# Patient Record
Sex: Female | Born: 1935 | Race: White | Hispanic: No | Smoking: Never smoker
Health system: Southern US, Community
[De-identification: ages and names within clinical notes are randomized; demographics above are authoritative.]

## PROBLEM LIST (undated history)

## (undated) DIAGNOSIS — F0390 Unspecified dementia without behavioral disturbance: Secondary | ICD-10-CM

## (undated) DIAGNOSIS — E213 Hyperparathyroidism, unspecified: Secondary | ICD-10-CM

## (undated) DIAGNOSIS — K5792 Diverticulitis of intestine, part unspecified, without perforation or abscess without bleeding: Secondary | ICD-10-CM

## (undated) DIAGNOSIS — M1990 Unspecified osteoarthritis, unspecified site: Secondary | ICD-10-CM

## (undated) DIAGNOSIS — I639 Cerebral infarction, unspecified: Secondary | ICD-10-CM

---

## 1997-07-03 ENCOUNTER — Encounter: Admission: RE | Admit: 1997-07-03 | Discharge: 1997-07-03 | Payer: Self-pay | Admitting: Family Medicine

## 1999-03-07 ENCOUNTER — Other Ambulatory Visit: Admission: RE | Admit: 1999-03-07 | Discharge: 1999-03-07 | Payer: Self-pay | Admitting: Internal Medicine

## 1999-05-06 ENCOUNTER — Encounter: Payer: Self-pay | Admitting: Internal Medicine

## 1999-05-06 ENCOUNTER — Encounter: Admission: RE | Admit: 1999-05-06 | Discharge: 1999-05-06 | Payer: Self-pay | Admitting: Internal Medicine

## 1999-05-24 ENCOUNTER — Encounter: Payer: Self-pay | Admitting: Internal Medicine

## 1999-05-24 ENCOUNTER — Encounter: Admission: RE | Admit: 1999-05-24 | Discharge: 1999-05-24 | Payer: Self-pay | Admitting: Internal Medicine

## 1999-06-09 ENCOUNTER — Encounter: Admission: RE | Admit: 1999-06-09 | Discharge: 1999-06-09 | Payer: Self-pay | Admitting: Internal Medicine

## 1999-06-09 ENCOUNTER — Encounter: Payer: Self-pay | Admitting: Internal Medicine

## 1999-07-27 ENCOUNTER — Encounter: Admission: RE | Admit: 1999-07-27 | Discharge: 1999-07-27 | Payer: Self-pay | Admitting: Internal Medicine

## 1999-07-27 ENCOUNTER — Encounter: Payer: Self-pay | Admitting: Internal Medicine

## 2000-03-07 ENCOUNTER — Other Ambulatory Visit: Admission: RE | Admit: 2000-03-07 | Discharge: 2000-03-07 | Payer: Self-pay | Admitting: Internal Medicine

## 2000-05-24 ENCOUNTER — Encounter: Payer: Self-pay | Admitting: Internal Medicine

## 2000-05-24 ENCOUNTER — Encounter: Admission: RE | Admit: 2000-05-24 | Discharge: 2000-05-24 | Payer: Self-pay | Admitting: Family Medicine

## 2000-05-28 ENCOUNTER — Encounter: Payer: Self-pay | Admitting: Internal Medicine

## 2000-05-28 ENCOUNTER — Encounter: Admission: RE | Admit: 2000-05-28 | Discharge: 2000-05-28 | Payer: Self-pay | Admitting: Internal Medicine

## 2001-05-27 ENCOUNTER — Encounter: Admission: RE | Admit: 2001-05-27 | Discharge: 2001-05-27 | Payer: Self-pay | Admitting: Internal Medicine

## 2001-05-27 ENCOUNTER — Encounter: Payer: Self-pay | Admitting: Internal Medicine

## 2002-12-02 ENCOUNTER — Encounter: Admission: RE | Admit: 2002-12-02 | Discharge: 2002-12-02 | Payer: Self-pay | Admitting: Sports Medicine

## 2002-12-09 ENCOUNTER — Encounter: Admission: RE | Admit: 2002-12-09 | Discharge: 2002-12-09 | Payer: Self-pay | Admitting: Sports Medicine

## 2002-12-23 ENCOUNTER — Ambulatory Visit: Admission: RE | Admit: 2002-12-23 | Discharge: 2002-12-23 | Payer: Self-pay | Admitting: Gynecologic Oncology

## 2002-12-24 ENCOUNTER — Encounter: Payer: Self-pay | Admitting: Gynecologic Oncology

## 2002-12-24 ENCOUNTER — Ambulatory Visit (HOSPITAL_COMMUNITY): Admission: RE | Admit: 2002-12-24 | Discharge: 2002-12-24 | Payer: Self-pay | Admitting: Gynecologic Oncology

## 2003-02-11 ENCOUNTER — Encounter (INDEPENDENT_AMBULATORY_CARE_PROVIDER_SITE_OTHER): Payer: Self-pay | Admitting: Specialist

## 2003-02-11 ENCOUNTER — Ambulatory Visit: Admission: RE | Admit: 2003-02-11 | Discharge: 2003-02-11 | Payer: Self-pay | Admitting: Gynecologic Oncology

## 2003-02-11 ENCOUNTER — Other Ambulatory Visit: Admission: RE | Admit: 2003-02-11 | Discharge: 2003-02-11 | Payer: Self-pay | Admitting: Obstetrics and Gynecology

## 2003-04-09 ENCOUNTER — Encounter: Admission: RE | Admit: 2003-04-09 | Discharge: 2003-04-09 | Payer: Self-pay | Admitting: Thoracic Surgery

## 2003-04-09 ENCOUNTER — Encounter: Admission: RE | Admit: 2003-04-09 | Discharge: 2003-04-09 | Payer: Self-pay | Admitting: Gynecology

## 2003-06-24 ENCOUNTER — Ambulatory Visit (HOSPITAL_COMMUNITY): Admission: RE | Admit: 2003-06-24 | Discharge: 2003-06-24 | Payer: Self-pay | Admitting: Gynecology

## 2003-06-26 ENCOUNTER — Ambulatory Visit (HOSPITAL_COMMUNITY): Admission: RE | Admit: 2003-06-26 | Discharge: 2003-06-26 | Payer: Self-pay | Admitting: Gynecology

## 2003-06-30 ENCOUNTER — Ambulatory Visit: Admission: RE | Admit: 2003-06-30 | Discharge: 2003-06-30 | Payer: Self-pay | Admitting: Gynecologic Oncology

## 2003-08-05 ENCOUNTER — Encounter: Admission: RE | Admit: 2003-08-05 | Discharge: 2003-08-05 | Payer: Self-pay | Admitting: Sports Medicine

## 2004-01-19 ENCOUNTER — Ambulatory Visit (HOSPITAL_COMMUNITY): Admission: RE | Admit: 2004-01-19 | Discharge: 2004-01-19 | Payer: Self-pay | Admitting: Neurology

## 2004-09-08 ENCOUNTER — Inpatient Hospital Stay (HOSPITAL_COMMUNITY): Admission: EM | Admit: 2004-09-08 | Discharge: 2004-09-12 | Payer: Self-pay | Admitting: *Deleted

## 2004-11-01 ENCOUNTER — Inpatient Hospital Stay (HOSPITAL_COMMUNITY): Admission: EM | Admit: 2004-11-01 | Discharge: 2004-11-04 | Payer: Self-pay | Admitting: Emergency Medicine

## 2004-11-04 ENCOUNTER — Ambulatory Visit: Payer: Self-pay | Admitting: Physical Medicine & Rehabilitation

## 2004-11-04 ENCOUNTER — Inpatient Hospital Stay
Admission: RE | Admit: 2004-11-04 | Discharge: 2004-11-15 | Payer: Self-pay | Admitting: Physical Medicine & Rehabilitation

## 2005-05-17 ENCOUNTER — Encounter: Admission: RE | Admit: 2005-05-17 | Discharge: 2005-05-17 | Payer: Self-pay | Admitting: Internal Medicine

## 2005-05-24 ENCOUNTER — Encounter (HOSPITAL_COMMUNITY): Admission: RE | Admit: 2005-05-24 | Discharge: 2005-08-22 | Payer: Self-pay | Admitting: Internal Medicine

## 2005-05-26 ENCOUNTER — Inpatient Hospital Stay (HOSPITAL_COMMUNITY): Admission: EM | Admit: 2005-05-26 | Discharge: 2005-06-06 | Payer: Self-pay | Admitting: Emergency Medicine

## 2005-05-30 ENCOUNTER — Encounter (INDEPENDENT_AMBULATORY_CARE_PROVIDER_SITE_OTHER): Payer: Self-pay | Admitting: *Deleted

## 2005-11-20 ENCOUNTER — Inpatient Hospital Stay (HOSPITAL_COMMUNITY): Admission: EM | Admit: 2005-11-20 | Discharge: 2005-11-29 | Payer: Self-pay | Admitting: Emergency Medicine

## 2005-11-21 ENCOUNTER — Encounter (INDEPENDENT_AMBULATORY_CARE_PROVIDER_SITE_OTHER): Payer: Self-pay | Admitting: Cardiology

## 2008-09-18 ENCOUNTER — Emergency Department (HOSPITAL_COMMUNITY): Admission: EM | Admit: 2008-09-18 | Discharge: 2008-09-18 | Payer: Self-pay | Admitting: Emergency Medicine

## 2009-07-17 ENCOUNTER — Emergency Department (HOSPITAL_BASED_OUTPATIENT_CLINIC_OR_DEPARTMENT_OTHER)
Admission: EM | Admit: 2009-07-17 | Discharge: 2009-07-17 | Payer: Self-pay | Source: Home / Self Care | Admitting: Emergency Medicine

## 2010-04-16 ENCOUNTER — Encounter: Payer: Self-pay | Admitting: Gynecology

## 2010-04-16 ENCOUNTER — Encounter: Payer: Self-pay | Admitting: Thoracic Surgery

## 2010-04-17 ENCOUNTER — Encounter: Payer: Self-pay | Admitting: Internal Medicine

## 2010-04-17 ENCOUNTER — Encounter: Payer: Self-pay | Admitting: Thoracic Surgery

## 2010-08-12 NOTE — H&P (Signed)
NAME:  Kaylee Braun, [place]                  ACCOUNT NO.:  [id number]   MEDICAL RECORD NO.:  [id number]          PATIENT TYPE:  INP   LOCATION:  1506                         FACILITY:  [facility]   PHYSICIAN:  Hal T. Stoneking, M.D. DATE OF BIRTH:  October 14, 1921   DATE OF ADMISSION:  10/31/2004  DATE OF DISCHARGE:  11/04/2004                                HISTORY & PHYSICAL   IDENTIFYING DATA:  Kaylee Braun is a very nice 77 year old [place] female.  She  had been staying with her daughter, plans eventually to move to [facility].  She enjoyed excellent health until she lost her balance at her  daughter's home and fell fracturing her wrist and the left 9th, 10th, and  11th ribs.  Because of the pain and inability to ambulate safely, she was  admitted for further treatment and observation.   CURRENT MEDICATIONS:  None.   PAST MEDICAL HISTORY:  Remarkable for diverticulosis, neuropathy involving  the left leg.   PAST SURGICAL HISTORY:  None.   SOCIAL HISTORY:  The patient recently is living with her daughter, getting  ready to move to [facility].  No history of alcohol or cigarette use.   FAMILY HISTORY:  Unremarkable.   REVIEW OF SYSTEMS:  She does complain of pain in the left wrist and ribs,  but prior to this and prior to the fall she stated that she had been feeling  fairly well.   PHYSICAL EXAMINATION:  VITAL SIGNS:  Blood pressure 174/83, pulse rate 69,  respiratory rate 18, O2 saturations 97%.  SKIN:  Normal.  HEENT:  Unremarkable.  LUNGS:  Clear.  HEART:  Regular rate and rhythm without murmurs, rubs, or gallops.  ABDOMEN:  Soft.  No hepatosplenomegaly or masses palpated.   LABORATORY DATA:  Montalvo count 11,900, hemoglobin 13.6, platelet count  171,000, 74% neutrophils, 15% lymphs, 10% monos. Sodium 137, potassium 4.5,  chloride 108, bicarb 25, glucose 114, BUN 25, creatinine 1.0, calcium 10.4.   X-rays of the left wrist revealed a transverse fracture of the distal radial  metaphysis.  Rib films revealed anterior fractures of the left 9th, 10th,  and 11th ribs.  Also there was a persistent nodule noted in the right lung.   ASSESSMENT:  Left wrist and rib fracture, she will need orthopedic  assessment and likely consultation from physical therapy before determining  her outcome and disposition.           ______________________________  Sunday Spillers Pete Glatter, M.D.     HTS/MEDQ  D:  12/18/2004  T:  12/19/2004  Job:  [id number]

## 2010-08-12 NOTE — Discharge Summary (Signed)
NAME:  Kaylee Braun, Kaylee Braun                  ACCOUNT NO.:  [id number]   MEDICAL RECORD NO.:  [id number]          PATIENT TYPE:  INP   LOCATION:  1506                         FACILITY:  [facility]   PHYSICIAN:  Ladell Pier, M.D.   DATE OF BIRTH:  February 11, 1922   DATE OF ADMISSION:  11/02/2006  DATE OF DISCHARGE:  11/04/2004                                 DISCHARGE SUMMARY   She is discharged to [facility] .   DISCHARGE DIAGNOSES:  1.  Multiple rib fractures. Left 9th, 10th and 11th rib fractures      anteriorly.  2.  Transverse fracture of the distal radial metaphysis of the left forearm.  3.  Diverticulosis.  4.  Neuropathy of the left leg.  5.  Elevated blood pressure, question secondary to Cymbalta.  6.  Pain secondary to the rib and radial fracture.  7.  Laceration to lower lip status post suture by emergency room physician.  8.  Hypercalcemia.   CONSULTATIONS:  1.  Orthopedics, Dr. Luiz Blare.  2.  SACU .   DISCHARGE MEDICATIONS:  1.  Vicodin 1 q.6 p.r.n. for pain.  2.  Tylenol 500 q. 6 p.r.n. for pain.  3.  Colace 100 mg daily.   PROCEDURE:  None.   HISTORY OF PRESENT ILLNESS:  The patient is an 77 year old [place] female. She  was staying with her daughter with plans of moving to __________ [facility].  She was in excellent health without any medical problems until she lost her  balance and fell at her daughter's house and fractured her wrist and rib and  she also had a laceration to the lower lip which was sutured. She was  admitted to the hospital for pain control.   Past medical history, family history, social history, meds, allergies, and  review of systems per admission H&P.   PHYSICAL EXAMINATION ON DISCHARGE:  VITAL SIGNS:  Temperature 97.4, pulse  80, respirations 18, blood pressure 168/82, pulse ox 98% on room air.  HEENT:  Head is normocephalic, atraumatic. Pupils equal round and reactive  to light. Lip with laceration and some edema.  CARDIOVASCULAR:  Regular rate and rhythm. No  murmurs, rubs or gallops.  LUNGS:  Clear to auscultation bilaterally. No wheezes, rhonchi or rales.  ABDOMEN:  Soft, nontender, nondistended, positive bowel sounds.  EXTREMITIES:  Her left arm has splint and wrapped. Lower extremities without  edema.   HOSPITAL COURSE:  #1.  RIB FRACTURE/RADIAL FRACTURE.  The patient was  admitted and treated with pain medication Vicodin. She was getting 2 Vicodin  q.6 and she got very sleep so it was decreased to 1 Vicodin q.6 alternating  with Tylenol. Her family thought that she was not strong enough to go to  __________ [facility] and they wanted her to get some physical therapy.  Physical therapy consulted, recommended going to [facility]  for short stay before  going home. The patient was discharged to [facility] for rehab.   #2.  ELEVATED BLOOD PRESSURE. During her hospitalization, her blood pressure  has been elevated. When the Cymbalta was added, it seemed as if her blood  pressure increased even more so the Cymbalta was discontinued. She was put  on Norvasc during the hospital stay to keep her blood pressure down. she  will have to be reevaluated outpatient for hypertension but her blood  pressure in the hospital has gone up to as high as 193/111, 170/100. It has  always been elevated. It has been even 203/83 during her hospitalization and  when she was admitted it was also elevated. A lot of this could be the pain  but she could also have a component of hypertension so will go ahead and  treat her with Norvasc for now and will reevaluate at a later date.   #3.  PAIN SECONDARY TO THE FRACTURE.  Will continue the Vicodin p.r.n.  alternating with Tylenol.   DISCHARGE LABS:  Sodium 137, potassium 4.1, chloride 105, CO2 25, glucose  113, BUN 18, creatinine 0.9, calcium 10.9. WBC 7.8, hemoglobin 14.6,  platelets 168. Her final lab was noted to be 10.9 but all the prior labs  calcium was normal, it was 10.4 the day prior to that. If it remains  elevated then  will monitor outpatient. It could be just a lab error since  all of her previous labs were normal.      Ladell Pier, M.D.  Electronically Signed     NJ/MEDQ  D:  11/04/2004  T:  11/04/2004  Job:  [id number]

## 2010-08-12 NOTE — H&P (Signed)
NAME:  Kaylee Braun, Kaylee Braun                  ACCOUNT NO.:  [id number]   MEDICAL RECORD NO.:  [id number]          PATIENT TYPE:  EMS   LOCATION:  MAJO                         FACILITY:  [facility]   PHYSICIAN:  Pramod P. Pearlean Brownie, MD    DATE OF BIRTH:  Jul 17, 1921   DATE OF ADMISSION:  11/20/2005  DATE OF DISCHARGE:                                HISTORY & PHYSICAL   REASON FOR REFERRAL:  Stroke.   HISTORY OF PRESENT ILLNESS:  Kaylee Braun is an 77 year old pleasant Caucasian  lady who lives in assisted living at [facility], who noticed the sudden  onset of slurred speech, right facial droop and right-sided weakness, onset  at about 2:00 p.m. today.  The patient went to Dr. Kevan Ny office and was  advised to come to [facility] emergency room for further evaluation.  I saw  the patient at 5:15 p.m., and she had mild right facial droop and right-  sided weakness which was improved but not back to baseline.  The patient has  no known prior history of stroke.  She does have history of peripheral  neuropathy for which she has seen Dr. Anne Hahn in the past.  She denies at  present headache, double vision, word-finding difficulties or numbness.   PAST MEDICAL HISTORY:  1. Diverticulosis.  2. Osteoporosis.  3. Osteoarthritis.  4. Hyperparathyroidism.  5. Peripheral neuropathy.   MEDICATION ALLERGIES:  None.   HOME MEDICATIONS:  1. Lasix.  2. Potassium.  3. Colace.  4. Multivitamin.  5. Aspirin.   PAST SURGICAL HISTORY:  1. Right breast lumpectomy.  2. Right radial and rib fracture.   FAMILY HISTORY:  Noncontributory.   SOCIAL HISTORY:  She lives in assisted living.  She is a widow.  She is  independent in activities of daily living.  She has a son who lives nearby  and a daughter.  She does not smoke or drink.   REVIEW OF SYSTEMS:  Not significant for recent fever, cough, shortness of  breath, diarrhea or other illness.   PHYSICAL EXAMINATION:  GENERAL:  A pleasant elderly Caucasian lady who  is  not in distress.  VITAL SIGNS:  She is afebrile.  Pulse is 70 per minute and regular,  respirations 16 per minute.  Distal pulses well felt.  Blood pressure  140/80, distal pulses were well felt.  HEENT:  Nontraumatic.  ENT exam significant for mild decrease in hearing  bilaterally.  NECK:  Supple without bruit.  CARDIAC:  Regular heart sounds.  LUNGS:  Clear to auscultation.  ABDOMEN:  Soft, nontender.  NEUROLOGIC:  The patient is awake, alert.  She is oriented to time, place  and person.  There is no aphasia or hypoxia.  There is mild dysarthria.  She  slurs some words.  There is mild right nasolabial fold asymmetry.  Eye  movements are full without nystagmus.  Visual fields full to confrontation  testing.  Palatal movements are diminished.  Tongue is midline.  Motor  system exam reveals mild right upper extremity drift, impaired finger-to-  nose coordination on the right.  There  is weakness of the right grip and  intrinsic hand muscles.  She __________ the left or right upper extremity.  There is mild right lower extremity drift.  There is minimum finger-to-nose  dysmetria on the right.  There is no sensory loss.  Reflexes are brisk.  Ankle jerks are depressed bilaterally.  Position and vibration impaired over  the toes.  Plantars are downgoing.  Gait was not tested.   DATA REVIEWED:  Admission labs are pending at this time.  On the NIH stroke  scale she is code 5.   IMPRESSION:  An 77 year old lady with the sudden onset of dysarthria, right  facial weakness and right hemiparesis secondary to left-sided subcortical  infarct.  The patient has presented beyond 3 hours from onset of symptoms  but within 6 hours.  She still has persistent deficit and may benefit with  consideration and participation in the ANCROD stroke trial.  I discussed  this with the patient, as well as the son who is present at the bedside, and  discussed risks and benefits of participation and clearly  mentioned that the  patient's participation in voluntary.  Patient has the option of being on  __________ medical treatment.  The patient and the son both feel she is  interested in being considered for the study.  We will see if the patient  meets the inclusion/exclusion criteria, and, if so, she will sign the  consent form before any study specific procedure is done.  She will be  admitted to the stroke unit for further evaluation.  Blood pressure will be  tightly controlled.  We will check MRI scan of the brain with MRA, as well  as carotid ultrasound and Doppler studies, fasting lipid profile, hemoglobin  A1c and homocysteine levels.  We will continue aspirin for now and may need  to change to Aggrenox later.           ______________________________  Sunny Schlein. Pearlean Brownie, MD     PPS/MEDQ  D:  11/20/2005  T:  11/20/2005  Job:  [id number]   cc:   Duncan Dull, M.D.

## 2010-08-12 NOTE — H&P (Signed)
NAME:  Kaylee Braun, Kaylee Braun                  ACCOUNT NO.:  [id number]   MEDICAL RECORD NO.:  [id number]          PATIENT TYPE:  EMS   LOCATION:  ED                           FACILITY:  [facility]   PHYSICIAN:  Hal T. Stoneking, M.D. DATE OF BIRTH:  [birth year]   DATE OF ADMISSION:  10/31/2004  DATE OF DISCHARGE:                                HISTORY & PHYSICAL   IDENTIFYING DATA:  This very nice 77 year old [place] female currently lives  with her daughter.  She has plans to move in to [facility].  She has  really been in excellent health. She did have an episode of  diverticulosis/diverticulitis treated as an outpatient.  Responded well to  p.o. antibiotics.  This evening she had gone out to dinner, was back home  getting into bed and her foot came out of her shoe.  She lost her balance,  striking her left anterior chest and her left wrist.  She sustained a  laceration to the left lower lip and received some sutures by the ER doctor.  Was also noted to have acute left anterior 9, 10 and 11 rib fractures for  which she is having significant chest wall pain and a left nondisplaced  transverse radial fracture of the wrist.  She is unable the move about in  bed, unable to ambulate, due to the pain in her left rib cage and will need  admission until her pain can get under control and she is again ambulatory.   ALLERGIES:  No known drug allergies.   CURRENT MEDICATIONS:  None.   PAST MEDICAL HISTORY:  1. Remarkable for diverticulosis.  2. Neuropathy of the left leg.  3. No hypertension, diabetes or coronary artery disease.   PAST SURGICAL HISTORY:  She states she has had no previous surgeries.   SOCIAL HISTORY:  Currently living with her daughter but plans to move to  [facility] [employer].  She does not smoke, does not consume alcohol.  She has two children, Pam [employer]  Pam Viveiros's phone number  is [phone].  Richard [place], [phone].   FAMILY HISTORY:  Remarkable for a  stroke in her mother and also her sister.   REVIEW OF SYSTEMS:  No headache, no change in vision or hearing.  No  abdominal pain.  Does complain of the chest wall pain that comes in  paroxysms.  No trouble with moving her bowels or urine.   PHYSICAL EXAMINATION:  VITAL SIGNS:  Blood pressure 170/96, temperature  98.3, pulse 93, respiratory rate 20.  HEENT:  Pupils are equal, round and reactive to light.  Disks are sharp.  Tympanic membranes normal.  Oropharynx reveals laceration of the left side  of the lower lip.  LUNGS:  Clear.  HEART:  Regular rate and rhythm without murmurs.  ABDOMEN:  Obese, soft.  No hepatosplenomegaly or masses palpated.  EXTREMITIES:  Left forearm is in a splint.  NEUROLOGIC:  She is alert and oriented x3.   LABORATORY DATA:  Chest x-ray reveals an anterior left 9, 10 and 11 rib  fractures.  She has a transverse fracture of the left distal radius that  appears to be well aligned.   ASSESSMENT:  1. Left rib fracture with considerable pain, unable to ambulate at the      current time.  Will try Vicodin to help control the pain and muscle      spasm.  2. Left distal radial fracture.  Will eventually need orthopedic followup.   PLAN:  Will admit.  Trial of p.o. Vicodin.  Discharge to [facility] when  able to ambulate safely.       HTS/MEDQ  D:  11/01/2004  T:  11/01/2004  Job:  [id number]

## 2010-08-12 NOTE — H&P (Signed)
NAME:  Kaylee Braun, Kaylee Braun                  ACCOUNT NO.:  [id number]   MEDICAL RECORD NO.:  [id number]          PATIENT TYPE:  EMS   LOCATION:  MAJO                         FACILITY:  [facility]   PHYSICIAN:  Georgann Housekeeper, MD      DATE OF BIRTH:  1921-11-17   DATE OF ADMISSION:  05/26/2005  DATE OF DISCHARGE:                                HISTORY & PHYSICAL   CHIEF COMPLAINT:  Nausea, vomiting, diarrhea.   PRIMARY CARE PHYSICIAN:  Dr. Johnella Moloney   Patient is an 77 year old female from the assisted living [facility].  History of peripheral neuropathy, osteoporosis, diverticulosis,  osteoarthritis, mild incontinence, hyperparathyroidism.  Presents with a two-  day history of nausea, vomiting, diarrhea started yesterday.  Said she had  multiple vomiting and diarrhea and continued through the night and she had  not thrown up since coming to the ER.  She had no blood in the stool.  No  abdominal pain.  Denies any fevers or chills.  No cough.  No shortness of  breath or chest pain.  She was found to be dehydrated mildly in the  emergency room, by the levels of BUN and creatinine.  She had been on Lasix  at home.   PAST MEDICAL HISTORY:  No known drug allergies.   CURRENT MEDICATIONS:  1.  Lasix 40 mg daily.  2.  Potassium 20 mEq b.i.d.  3.  Colace 100 mg daily.  4.  Multivitamin.  5.  Aspirin 81 mg daily.   MEDICAL PROBLEMS:  1.  Peripheral neuropathy.  2.  Osteoporosis.  3.  Diverticulosis.  4.  DJD.  5.  Incontinence.  6.  Bilateral ovarian mass.  7.  Mildly elevated calcium with mild hyperparathyroidism.   SURGICAL HISTORY:  1.  Right breast lumpectomy.  2.  Radial fracture and rib fracture in 2006.   FAMILY HISTORY:  Noncontributory.   SOCIAL HISTORY:  She lives at assisted living [facility].  Widow.  Has a  son and a daughter.  Son was present with her.   PHYSICAL EXAMINATION:  VITAL SIGNS:  Temperature 99.3, blood pressure  133/76, pulse 118, respirations 18, 98%  saturations.  GENERAL:  Awake and alert, cooperative, no acute distress.  HEENT:  Mucous membranes was mildly dry.  NECK:  Supple.  CARDIAC:  S1, S2 without murmurs.  ABDOMEN:  Soft.  No rebound or guarding.  Nondistended.  Nontender.  EXTREMITIES:  No edema.  SKIN:  No rashes.   LABORATORY DATA:  Martinec count 19.3, hemoglobin 17.8, platelets 253.  Chemistries:  Sodium 149, potassium 5, BUN 47, creatinine 1.5, calcium 11.1,  glucose 189.   IMPRESSION:  77 year old female with nausea, vomiting, and dehydration.  1.  Nausea, vomiting, diarrhea.  Probably acute gastroenteritis.  Will plan      admit for observation.  Intravenous fluids.  Clear liquid diet.  Will      check some stool studies.  Hold off Lasix and potassium.  As far as      mildly elevated leukocytosis, repeat it again.  Add lipase and if she  does okay with a diet tomorrow can send her back to the assisted living.      Georgann Housekeeper, MD  Electronically Signed     KH/MEDQ  D:  05/26/2005  T:  05/26/2005  Job:  [id number]

## 2010-08-12 NOTE — Discharge Summary (Signed)
NAME:  Kaylee Braun, Kaylee Braun                  ACCOUNT NO.:  [id number]   MEDICAL RECORD NO.:  [id number]          PATIENT TYPE:  ORB   LOCATION:  4526                         FACILITY:  [facility]   PHYSICIAN:  Ellwood Dense, M.D.   DATE OF BIRTH:  April 01, 1921   DATE OF ADMISSION:  11/04/2004  DATE OF DISCHARGE:  11/14/2004                                 DISCHARGE SUMMARY   DISCHARGE DIAGNOSES:  1.  Left distal radius fracture.  2.  Multiple left rib fractures of 9th, 10th and 11 ribs.  3.  Pain management.  4.  Subcutaneous Lovenox for deep venous thrombosis prophylaxis.  5.  History of diverticulitis.   HISTORY OF PRESENT ILLNESS:  This is an 77 year old Kaylee Braun female admitted  November 01, 2004 after a fall, loss of consciousness, with complaint of left  wrist and left thorax pain.  X-rays with nondisplaced left distal radius  fracture and left rib fractures, 9th, 10th and 11th, Orthopedic Services,  Dr. Luiz Blare, weightbearing as tolerated with short arm cast applied.  Subcutaneous tissue Lovenox for deep venous thrombosis prophylaxis.  The  patient was admitted to subacute care services.   PAST MEDICAL HISTORY:  See discharge diagnoses.   ALLERGIES:  NONSTEROIDAL ANTI-INFLAMMATORIES.   SOCIAL HISTORY:  The patient has lived with her daughter for the last 7  weeks, had previously lived alone until bout of diverticulitis.  Plan is to  be discharged to [facility].   MEDICATIONS PRIOR TO ADMISSION:  None.   HOSPITAL COURSE:  Patient with progressive gains while on rehab services  with therapies initiated daily.  The following issues were followed during  patient's rehab course:  Pertaining to Kaylee Braun's left distal radius  fracture, a short arm case had been applied, weightbearing as tolerated,  multiple left rib fractures with conservative care.  She was using Vicodin  as needed for pain management with good results.  Subcutaneous Lovenox for  deep venous thrombosis  prophylaxis was discontinued at time of discharge as  her mobility greatly improved.  She had a history of diverticulitis without  issue during her rehab stay.  Some modest elevation in diastolic blood  pressures of mid-to-upper 80s.  She had no dizziness and no chest pain, no  shortness of breath.  She was advised to follow up with her primary care  physician, Dr. Merlene Laughter.  Plan was to be discharged to [facility] on November 14, 2004.   FUNCTIONAL MOBILITY:  She is minimum-to-moderate assist -- bed mobility,  minimal assist -- transfers, ambulating up to 70 feet with minimal  assistance and a platform walker, simple setups for activities of daily  living, has had minimal assist for lower body dressing.   DISCHARGE MEDICATIONS:  Discharge medications at time of dictation included:  1.  Vicodin as needed -- pain.  2.  A multivitamin once a day.  3.  Tylenol as needed.   ACTIVITY:  Activity as tolerated.   DIET:  Regular.   FOLLOWUP:  She would follow up with Dr. Pete Glatter, medical management; Dr.  Jodi Geralds, [facility].      Mariam Dollar, P.A.    ______________________________  Ellwood Dense, M.D.    DA/MEDQ  D:  11/14/2004  T:  11/14/2004  Job:  [id number]   cc:   Hal T. Stoneking, M.D.  [address]  [place], [place] [address]  Fax: [phone]   Harvie Junior, M.D.  [address]  [place]  [place] [address]  Fax: [phone]

## 2010-08-12 NOTE — Discharge Summary (Signed)
NAME:  Kaylee Braun, Kaylee Braun                  ACCOUNT NO.:  [id number]   MEDICAL RECORD NO.:  [id number]          PATIENT TYPE:  INP   LOCATION:  5738                         FACILITY:  [facility]   PHYSICIAN:  Candyce Churn, M.D.DATE OF BIRTH:  09-17-1921   DATE OF ADMISSION:  09/08/2004  DATE OF DISCHARGE:  09/12/2004                                 DISCHARGE SUMMARY   DISCHARGE DIAGNOSES:  1.  Sigmoid diverticulitis.  2.  Cystic ovarian mass measuring 9.6 x 9.9 cm to the right of midline.      Comparison size is slightly larger than it was 9 x 9.3 cm on CT scan of      the pelvis on January 19, 2004.  CA-125 level is normal at 10.  3.  Peripheral neuropathy.  4.  History of right breast lumpectomy.  5.  History of diverticulosis.   DISCHARGE MEDICATIONS:  1.  Ciprofloxacin 250 mg p.o. b.i.d. x7 days.  2.  Flagyl 250 mg three times a day for seven days.  3.  Neurontin 100 mg p.o. t.i.d.  4.  Colace 100 mg p.o. t.i.d. p.r.n. constipation.  5.  Triamcinolone cream 0.1% applied to eczematous rash b.i.d.   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   HOSPITAL COURSE:  Patient is an 77 year old female with history of  osteoporosis, allergies, diverticulosis, DJD of the lumbar spine, peripheral  neuropathy of the feet, and bilateral ovarian masses.  She has had a  borderline increase in calcium in the past with borderline elevation of the  PTH.   She presented to the emergency room on September 08, 2004 with sudden onset of  left lower quadrant pain and two days of constipation and CT scanning was  consistent with sigmoid  diverticulitis.  She also was having a slightly  enlarging solid cystic mass most consistent with ovarian etiology.  This has  also been noted on January 19, 2004.  This is being followed by Dr. [name].   She also has a history of small right pulmonary nodule.  This has been  followed by Dr. Edwyna Shell.   Patient was admitted and treated with intravenous fluids and antibiotic  therapy in the form of IV Cipro and IV Flagyl and had a very prompt response  with reduction in abdominal pain.   Patient was afebrile on admission but Cull[place] cell count dropped from 9500 on  admission to 4900 on September 10, 2004.   Other laboratories reveal a hemoglobin of 14.3 on admission, platelet count  162,000.  Sedimentation rate on admission was 13 mm/hour.  LFTs were within  normal limits on September 08, 2004 and BMET was normal with a creatinine of 1  and a glucose of 98.  TSH was normal at  1.702 and CA-125 antigen was 10  units/mL and B12 level was 404, normal.  Urinalysis on admission was within  normal limits.   Patient was discharged in improved condition and will receive GYN follow-up  and I will plan to see her back in the office in one week.   She was to get home health through [facility].      Candyce Churn, M.D.  Electronically Signed     RNG/MEDQ  D:  11/10/2004  T:  11/10/2004  Job:  [id number]

## 2010-08-12 NOTE — Consult Note (Signed)
NAME:  Kaylee Braun, Kaylee Braun                  ACCOUNT NO.:  [id number]   MEDICAL RECORD NO.:  [id number]          PATIENT TYPE:  INP   LOCATION:  5740                         FACILITY:  [facility]   PHYSICIAN:  Velora Heckler, MD      DATE OF BIRTH:  06-22-1921   DATE OF CONSULTATION:  05/29/2005  DATE OF DISCHARGE:                                   CONSULTATION   REFERRING PHYSICIAN:  Dr. Johnella Moloney.   CHIEF COMPLAINT:  Primary hyperparathyroidism.   HISTORY OF PRESENT ILLNESS:  Kaylee Braun is an 77 year old [place] female  admitted on May 26, 2005 by Dr. Johnella Moloney with diarrhea, nausea,  weakness, and fatigue. Prior to admission, the patient had been undergoing a  workup for hypercalcemia. She had been found to have serum calcium levels  ranging between 11.1 and 11.5. She was started on oral Lasix and hydration.  Laboratory studies included an intact PTH level on May 03, 2005. This  was elevated at 201. The patient was subsequently scheduled for sestamibi  scan in nuclear medicine which was performed on February28, 2007. This  revealed evidence of a left inferior parathyroid adenoma. I have reviewed  this study. The patient has known osteoporosis. She has no history of  nephrolithiasis. She does have chronic fatigue. General surgery is now  consulted for evaluation for minimally invasive parathyroidectomy.   PAST MEDICAL HISTORY:  1.  History of peripheral neuropathy.  2.  History of osteoporosis.  3.  History of degenerative joint disease.  4.  History of incontinence.  5.  History of bilateral ovarian masses.  6.  Status post right breast lumpectomy for benign disease.  7.  History of left wrist fracture 2006.   MEDICATIONS:  Lasix, potassium, Colace, enteric-coated baby aspirin,  multivitamin.   ALLERGIES:  None known.   SOCIAL HISTORY:  The patient lives in assisted living. She is widowed. Her  children live locally. She denies tobacco or alcohol use.   FAMILY HISTORY:  No  history of familial endocrinopathy.   LABORATORY STUDIES:  Lecy count 8.5, hemoglobin 13.0, platelet count  168,000. Electrolytes were normal. Her calcium level this morning is down to  10.3 with therapy. PTH level is pending. TSH level was 0.314.   RADIOGRAPHY:  Sestamibi scan performed nuclear medicine February28, 2007  demonstrating evidence of left inferior parathyroid adenoma.   IMPRESSION:  Primary hyperparathyroidism with chronic fatigue and  osteoporosis.   PLAN:  I have discussed all these issues with the patient. I feel she is a  good candidate for a minimally invasive surgery. I have discussed the case  with Dr. Johnella Moloney. Risk and benefits of the procedure have been  reviewed with the patient. The patient would like to proceed with surgery in  hopes that we will improve her chronic fatigue and GI complaints. I think  she is an acceptable risk for general anesthesia and for surgery. We will  try to schedule her at the [facility] operating room in the near future.      Velora Heckler, MD  Electronically Signed  TMG/MEDQ  D:  05/29/2005  T:  05/29/2005  Job:  [id number]   cc:   Candyce Churn, M.D.  Fax: [phone]

## 2010-08-12 NOTE — Discharge Summary (Signed)
NAME:  Kaylee Braun, Kaylee Braun                  ACCOUNT NO.:  [id number]   MEDICAL RECORD NO.:  [id number]          PATIENT TYPE:  INP   LOCATION:  5740                         FACILITY:  [facility]   PHYSICIAN:  Candyce Churn, M.D.DATE OF BIRTH:  04-Dec-1921   DATE OF ADMISSION:  05/26/2005  DATE OF DISCHARGE:                                 DISCHARGE SUMMARY   DISCHARGE DIAGNOSES:  1.  Viral gastroenteritis, resolved.  2.  Primary hyperparathyroidism secondary to large left parathyroid adenoma.  3.  Left lower pole parathyroidectomy per Dr. Darnell Level on May 30, 2005      with normalization and excellent clinical response to persistent fatigue      and failure to thrive.  4.  Right ovarian mass, stable in comparison to imaging techniques from      October 2006.  5.  Mild peripheral neuropathy.  6.  Osteoporosis.  7.  Diverticulosis.  8.  Degenerative joint disease.  9.  History of right breast lumpectomy.  10. Left radial fracture and rib fracture 2006 secondary to fall.  11. Failure to thrive, improving since removal of parathyroid adenoma.   DISCHARGE MEDICATIONS:  1.  Calcium carbonate 1200 mg daily.  2.  Multivitamin one daily.  3.  Dulcolax 10 mg p.o. p.r.n. constipation.  4.  Ambien 5 mg p.o. h.s. p.r.n. insomnia.   PROCEDURES:  Left lower pole parathyroid adenoma with parathyroidectomy at  same site per Dr. Darnell Level on May 30, 2005.   IMAGING STUDIES:  1.  Chest CT May 29, 2005:  Right lower lobe subpleural nodule stable.      Recommended follow-up in six to 12 months to document further stability.  2.  Pelvic ultrasound:  Right adnexal cyst which is septated measuring 8.9 x      7.7 x 7.4 cm.  It appears slightly smaller than measurements obtained on      CT scan of the abdomen on January 18, 2005.  Left ovary not seen.  3.  Parathyroid scintigraphy and SPECT imaging May 24, 2005:      Parathyroid adenoma localized to the lower pole of the left lobe of the  thyroid gland.   DISCHARGE LABORATORIES:  From June 05, 2005 Casali count 6100, hemoglobin  11.5, platelet count 209,000.  Sodium 143, potassium 4, chloride 111,  bicarbonate 25, glucose 95, BUN 6, creatinine 0.9, calcium 8.6.  Thyroid  functions on May 29, 2005 reveal a free T4 of 1.19, TSH of 0.881, and total  T3 of 77.2, all within normal limits.   HOSPITAL COURSE:  [name] is a very pleasant 77 year old female who  had been living at [facility] assisted living with essentially failure to  thrive.  It was noted that she was hypercalcemic and parathyroid adenoma was  localized as above.  She has been improved markedly since having had this  removed over the last week.   She was admitted on May 26, 2005 by Dr. Georgann Housekeeper for gastroenteritis  presenting with nausea, vomiting, diarrhea very consistent with the  community Norovirus which has been in  our community over the last couple of  months.  She was treated with IV fluids and clear liquid diet which has  progressed now to a regular diet and she is doing well in that regard.   She was seen in consultation by Dr. Darnell Level and parathyroidectomy was  performed on May 30, 2005 and calcium has normalized, had been in the 11-12  range and she is feeling much better in terms of stamina and even mental  status is much clearer.  Overall, seems to be doing quite well and is  ambulating now safely with a walker and will be transferred back to [facility] assisted living today.   She should have follow-up for her right adnexal mass with ultrasound in  three to four months and I will plan to see her back in my office in about  three weeks.      Candyce Churn, M.D.  Electronically Signed     RNG/MEDQ  D:  06/06/2005  T:  06/06/2005  Job:  [id number]   cc:   Velora Heckler, MD  [address] [place]  [place] [address]

## 2010-08-12 NOTE — Discharge Summary (Signed)
NAME:  Kaylee Braun, Kaylee Braun                  ACCOUNT NO.:  [id number]   MEDICAL RECORD NO.:  [id number]          PATIENT TYPE:  INP   LOCATION:  6741                         FACILITY:  [facility]   PHYSICIAN:  Pramod P. Pearlean Brownie, MD    DATE OF BIRTH:  [birth year]   DATE OF ADMISSION:  11/20/2005  DATE OF DISCHARGE:  11/22/2005                                 DISCHARGE SUMMARY   DISCHARGE DIAGNOSES:  1. Small left cerebellar infarct.  2. Urinary tract infection.  3. Mild dementia at baseline.  4. Elevated parathyroid hormone.  5. Dyslipidemia.   MEDICATIONS AT TIME OF DISCHARGE:  1. Cipro 500 mg p.o. b.i.d., start November 20, 2005, end November 23, 2005      a.m. dose.  2. Aspirin 325 mg a day.  3. Zocor 20 mg a day.   STUDIES PERFORMED:  1. MRI of the brain shows a punctate area of restricted perfusion weighted      image within the left cerebellar hemisphere with multiple areas of      susceptibility within the basal ganglia and ____________ nuclei      bilaterally. Advanced Silveria matter changes.  2. MRA of the head shows atherosclerotic irregularity of the supraclinoid      internal carotid artery bilaterally with mild approximately 50%      stenosis on the left, attenuation of the distal MCA and PCA vessels      bilaterally, fetal-type right posterior cerebral artery.  3. Chest x-ray shows a trace of left pleural effusion. Right lower lobe      nodular is not as well seen as on prior studies but which may be due to      portable technique. Follow up as previously recommended on chest CT      report of May 29, 2005.  4. EKG showed normal sinus rhythm, possible atrial enlargement, possible      anterior infarct - age undetermined.  5. Carotid Doppler shows no ICA stenosis.  6. Two-D echocardiogram has been completed; results are pending.  7. CT of the brain done on admission shows chronic small-vessel ischemic      disease. Bilateral remote appearing thalamic lacunar infarcts. Remote  basal ganglion lacunar infarcts.   LABORATORY STUDIES:  Homocysteine 8.5. Hemoglobin A1c 5.5. Cholesterol 204,  triglycerides 115, HDL 50 and LDL 131. Her urinalysis showed large leukocyte  esterase with urine culture pending at time of discharge. CBC with  hemoglobin 15.2, Barros blood cells 7.8. Chemistry normal. Coagulation  studies normal. Liver function test normal. Calcium normal.   HISTORY OF PRESENT ILLNESS:  Ms. Kaylee Braun is an 77 year old Caucasian  female who lives in assisted living who noticed a sudden onset of slurred  speech, right facial weakness and right sided weakness at approximately 2  p.m. the day of admission. The patient went to Dr. Kevan Ny' office and was  advise to come to the [facility] Emergency Room for further evaluation. The  patient was seen at 5:15 p.m. where she had a mild right facial droop and  right sided weakness which was improved but  not back to baseline. The  patient has no known history of stroke. She does have history of peripheral  neuropathy for which she has seen Dr. Anne Hahn in the past. She denies  headache, double vision, word-finding difficulties or numbness. She was not  a TPA candidate secondary to time. She was admitted to the hospital for  further stroke evaluation.   HOSPITAL COURSE:  MRI did reveal an acute infarct in the left cerebellum.  She neurologically cleared of symptoms in the hospital. The patient did have  some difficulty swallowing which upon questioning family was old. The  patient had been recommended to be on a thick-liquid diet at home; however,  she was not compliant. She routinely coughs after eating and drinking food.  This also was noted in the hospital where a modified barium swallow was  performed. During the modified barium swallow, the patient had positive  aspiration of thin liquids which was easily managed by the recommendation of  nectar-thick consistency. She had no PT or OT acute needs, but PT was   recommended for followup at discharge as was speech therapy. She was started  on aspirin for secondary stroke prevention and a statin for elevated LDL.  She will need follow up by her primary care physician and Dr. Pearlean Brownie.   CONDITION ON DISCHARGE:  The patient is alert and oriented x3. Speech clear.  No aphasia. She has visual fields that are full. Face is symmetric, and she  has no drift. Her strength is normal. She has decreased sensation in her  lower extremities. Her chest is clear to auscultation. Her heart rate is  regular but tachycardic. She aspirates thin liquids.   DISCHARGE PLAN:  1. Discharge back to assisted living as long as they can manage her diet.  2. Nectar-thick consistency liquids.  3. Speech therapy for followup. Recommend outpatient speech therapy where      they can do vital stim. If patient is unable to get transportation, we      will do home health speech therapy. Will arrange physical therapy to      compliment speech therapy, either at home or as an outpatient.  4. Aspirin for secondary stroke prevention.  5. __________ for elevated LDL. Goal LDL less than 100. Will need follow      up in 4 to 6 weeks by her primary care physician.  6. Follow up with primary care physician in one month.  7. Follow up with Dr. Delia Heady at [facility] in 2 to 3      months.      Annie Main, N.P.    ______________________________  Sunny Schlein. Pearlean Brownie, MD    SB/MEDQ  D:  11/22/2005  T:  11/22/2005  Job:  [id number]   cc:   [facility]  Candyce Churn, M.D.

## 2010-08-12 NOTE — Op Note (Signed)
NAME:  Kaylee Braun, Kaylee Braun                  ACCOUNT NO.:  [id number]   MEDICAL RECORD NO.:  [id number]          PATIENT TYPE:  INP   LOCATION:  5740                         FACILITY:  [facility]   PHYSICIAN:  Velora Heckler, MD      DATE OF BIRTH:  [birth year]   DATE OF PROCEDURE:  05/30/2005  DATE OF DISCHARGE:                                 OPERATIVE REPORT   PREOPERATIVE DIAGNOSIS:  Primary hyperparathyroidism.   POSTOPERATIVE DIAGNOSIS:  Primary hyperparathyroidism, thyroid nodule.   PROCEDURE:  1.  Minimally invasive left inferior parathyroidectomy.  2.  Excision thyroid nodule from thyroid isthmus.   SURGEON:  Velora Heckler, M.D., FACS   ASSISTANT:  Anselm Pancoast. Zachery Dakins, M.D., FACS   ANESTHESIA:  General.   ESTIMATED BLOOD LOSS:  Minimal.   PREPARATION:  Betadine.   COMPLICATIONS:  None.   INDICATIONS:  The patient is 77 year old [place] female from [place], [place], referred by Dr. Johnella Moloney for hyperparathyroidism.  The  patient had been admitted on March 2.  She was seen in consultation due to  known hypercalcemia and elevated parathyroid hormone levels.  Sestamibi scan  performed February28 localized an adenoma to the left inferior position.  The patient is prepared brought to the operating room.   BODY OF REPORT:  The procedure is done in OR number 1 at [facility].  The patient is brought to the operating room and placed in supine  position on the operating room table.  Following the administration of  general anesthesia, the patient is prepped and draped in usual strict  aseptic fashion.  After ascertaining that an adequate level of anesthesia  had been obtained, a left inferior neck incision was made a #10 blade.  Dissection was carried through subcutaneous tissues and platysma.  Hemostasis was obtained with electrocautery.  Skin flaps were developed  cephalad and caudad.  Strap muscles were incised in the midline.  There is a  palpable 1 cm nodule  in the mid isthmus of the thyroid.  The remainder of  the thyroid gland is normal to palpation.  Strap muscles are reflected  laterally.  Left thyroid lobe was mobilized.  Venous tributaries were  divided between small and medium hemoclips.  Dissection posteriorly reveals  an enlarged parathyroid gland lying just posterior to the lateral edge of  the esophagus.  The recurrent nerve is immediately adjacent.  With gentle  blunt dissection, the parathyroid gland is dissected away from the nerve.  Vascular tributaries were divided between small and medium hemoclips.  The  gland was completely mobile mobilized and excised.  It measures 2.5 x 1 x 1  cm in size.  Dr. Berneta Levins performed frozen section and confirms  parathyroid tissue consistent with adenoma.  Good hemostasis was obtained.   Thyroid nodule is palpable in the mid isthmus.  The isthmus was mobilized  superiorly and inferiorly.  Hemostats were used to divide the thyroid  parenchyma circumferentially around the nodule and the nodule was completely  excised.  It is submitted for permanent section only.  The  thyroid tissue  was suture ligated with 3-0 Vicryl suture ligatures and good hemostasis was  noted.  Surgicel was placed in the left neck.  Strap muscles were  reapproximated in the midline with interrupted 3-0 Vicryl sutures.  Platysma  was closed with interrupted 3-0 Vicryl sutures.  Skin was anesthetized with  local Marcaine.  Skin edges were reapproximated running 4-0 Vicryl  subcuticular suture.  Wound is washed and dried.  Benzoin and Steri-Strips  were applied.  Sterile dressings were applied.  The patient is awakened from  anesthesia and brought to the recovery room in stable condition.  The  patient tolerated the procedure well.      Velora Heckler, MD  Electronically Signed     TMG/MEDQ  D:  05/30/2005  T:  05/30/2005  Job:  [id number]   cc:   Candyce Churn, M.D.  Fax: [phone]

## 2010-08-12 NOTE — Consult Note (Signed)
NAME:  Kaylee Braun, Kaylee Braun                            ACCOUNT NO.:  [id number]   MEDICAL RECORD NO.:  [id number]                   PATIENT TYPE:  OUT   LOCATION:  GYN                                  [facility]ACILITY[facility]  [facility]   PHYSICIAN:  John T. [name], M.D.                 DATE OF BIRTH:  [birth year]   DATE OF CONSULTATION:  02/11/2003  DATE OF DISCHARGE:                                   CONSULTATION   CHIEF COMPLAINT:  Follow-up of pelvic mass and pulmonary nodule.   INTERVAL HISTORY:  The patient's pulmonary nodule was evaluated with a  negative PET scan on January 02, 2003, and that scan showed a photopenic area  in the midpelvis correlated with her pelvic mass.  On the basis of these  findings, Ines Bloomer, M.D., recommended that the patient have follow-  up scans at two and six months following the PET scan to further follow up  her 1 cm spiculated right lower lobe lesion.  The patient has had no further  symptoms from her adnexal mass.   HISTORY OF PRESENT ILLNESS:  The patient was evaluated for pyuria in  September 2004, and ultrasound revealed bilateral adnexal masses including  an 8.7 x 8.3 cm simple cyst on the right and multicystic left adnexal mass  with no area of free fluid.  She subsequently had a CT scan revealing a 1 cm  spiculated nodule in the right lower lobe of the lung and confirming her  adnexal masses.  CA125 value was less than 12.   PAST MEDICAL HISTORY:  Significant for hypertension, plantar fasciitis,  urinary urgency, osteoporosis.   PAST SURGICAL HISTORY:  Benign breast lumpectomy and traumatic pelvic/lower  extremity fracture at age 56.   MEDICATIONS:  Atenolol, aspirin, calcium carbonate, magnesium, multivitamin,  Ditropan, Flonase, fish oil, Restoril, Fosamax, and Lacril.   ALLERGIES:  DAYPRO, Z-PAK, and VIOXX.   Otherwise, past history, personal and social history, family history, and  review of systems are unchanged from those recorded during my  intake  evaluation on December 23, 2002.   PHYSICAL EXAMINATION:  VITAL SIGNS:  Weight 151 pounds, blood pressure  154/84.  LYMPHATIC:  There is no pathologic lymphadenopathy.  ABDOMEN:  Soft and benign without ascites, mass, tenderness, or  organomegaly.  BACK:  There is tenderness to percussion over the lumbar spine but no CVA  tenderness.  EXTREMITIES:  No edema.  PELVIC:  External genitalia and BUS are normal to inspection and palpation.  The vaginal mucosa is atrophic without lesions.  The cervix is mobile and  deviated to the patient's left.  Bimanual and rectovaginal examinations  reveal a normal uterus deviated to the left and an 8 cm, cystic mass  involving the right adnexa.  This is mobile without fixation, and there is  no cul-de-sac nodularity.   ASSESSMENT:  Benign adnexal masses.   PLAN:  The  patient wishes to defer surgery unless completely necessary.  We  performed a Pap smear today so that, if negative, we could consider a  laparoscopic BSO rather than an open procedure involving removal of the  uterus.  This mainly has to do with issues revolving around recovery from  surgery.  I recommended that we set up so that her follow-up pulmonary scan  include an abdominopelvic CT scan and if the mass is stable, that the repeat  in four month also include the pelvis.  We will make sure that we get copies  of these reports and I would like to see her after her second CT scan unless  she develops significant abdominopelvic pain or other worrisome symptoms.  I  believe that the likelihood of malignancy in this scan-detected mass less  than 0.5% and that serial observation would be a reasonable option,  particularly until we sort out the pulmonary nodule.                                               John T. [name], M.D.    JTS/MEDQ  D:  02/11/2003  T:  02/11/2003  Job:  [id number]   cc:   Candyce Churn, M.D.  [address] [place]  [place] [address]  Fax:  [phone]   Rozanna Boer., M.D.  [address]  [place]  [place] [address]  Fax: [phone]   Telford Nab, R.N.  [phone]  [place], [place] [address]   Ines Bloomer, M.D.  [address]  [place]  [place] [address]

## 2010-08-12 NOTE — H&P (Signed)
NAME:  Kaylee Braun, Kaylee Braun                  ACCOUNT NO.:  [id number]   MEDICAL RECORD NO.:  [id number]          PATIENT TYPE:  INP   LOCATION:  5738                         FACILITY:  [facility]   PHYSICIAN:  Candyce Churn, M.D.DATE OF BIRTH:  May 20, 1921   DATE OF ADMISSION:  09/08/2004  DATE OF DISCHARGE:  09/12/2004                                HISTORY & PHYSICAL   CHIEF COMPLAINT:  Abdominal pain.   HISTORY OF PRESENT ILLNESS:  An 77 year old female with a history of  osteoporosis, allergies, diverticulosis, and DJD of the lumbar spine.  Also  has urge incontinence and peripheral neuropathy in her feet as well as  bilateral ovarian masses, borderline hypercalcemia with borderline  hyperparathyroidism.  She presented to the emergency room on the morning of  September 08, 2004, with sudden onset of left lower quadrant pain and two days of  constipation.  Abdominal CT scan is consistent with sigmoid diverticulitis.  She also has a slightly enlarging solid/cystic mass consistent with an  ovarian mass on pelvic CT in October of 2005.  Dimensions were 9.0 x 9.3 cm  and are now 9.6 x 9.9 cm on comparison with a CT scan from January 19, 2004.   She is admitted for IV antibiotic therapy and further workup.   MEDICATIONS:  1.  Neurontin 100 mg p.o. t.i.d.  2.  Alpha lipoic acid supplementation.   PAST SURGICAL HISTORY:  Right breast lumpectomy.   HEALTH MAINTENANCE STATUS:  Sigmoidoscopy in March of 1999 revealed 250 cm  were within normal limits except for a sigmoid diverticulosis.   FAMILY HISTORY:  Parkinson's disease in her father.  Mother had hypertension  and a stroke.  A sister died of emphysema and also had a stroke, and a  brother who had a stroke.  He was severely obese.   HABITS:  No smoking or alcohol.   SOCIAL HISTORY:  The patient has been widowed for more than 25 years.  She  is a housewife.  Husband was very involved in athletics and activities for  children in [place]  for many years.   REVIEW OF SYSTEMS:  Denies fevers or chills but does complain of left lower  quadrant abdominal pain.  Denies melena or bright red blood from rectum, or  urinary frequency, hesitancy, or dysuria.   PHYSICAL EXAMINATION:  GENERAL:  She is alert and oriented and in no acute  distress.  VITAL SIGNS:  Temperature is 98.8, pulse 71 and regular, respiratory rate 18  and unlabored, blood pressure 122/73.  O2 saturation on room air is 92%.  HEENT:  She is atraumatic, normocephalic.  NECK:  Supple without JVD, thyromegaly, or carotid bruits.  CHEST:  Clear to auscultation.  CARDIAC:  Regular rhythm with no murmurs, rubs, or gallops.  ABDOMEN:  Soft and nondistended but is tender in the left lower quadrant to  light palpation.  There does seem to be a question of rebound.  EXTREMITIES:  Without cyanosis, clubbing, or edema.  No rashes noted.  Warm  distally in hands and feet.  Capillary refill is within two to  three  seconds.  NEUROLOGICAL:  She is oriented x3.  She has had a history of mild, short-  term, memory loss.  Otherwise nonfocal except her feet have a sensation of a  burning pain chronically.   LABORATORY DATA:  Rock count 9500, hemoglobin 14.3, platelet count 102,000.  Sodium 134, potassium 4.0, chloride 106, bicarb 23, BUN 15, creatinine 1.0,  glucose 106.  LFTs are normal.  Calcium 10.3.  Urinalysis reveals pyuria  with 0 to 2 red cells and negative nitrite.   Abdominal and pelvic CT scan is as above.   ASSESSMENT:  1.  Diverticulitis - treated with IV Cipro and Flagyl, and IV Dilaudid for      pain.  Will also treat with IV fluids and start a clear liquids and      advance to full liquids as tolerated.  Will give a stool softener as      well.  2.  Neuropathy - a B12, TSH, and sed rate.  3.  Ovarian mass - further workup per Dr. [name].  Also, Dr. Edwyna Shell follows      her for her right lower lobe pulmonary nodule, which was      uncharacteristic for  malignancy on PET scan in 2004.      Candyce Churn, M.D.  Electronically Signed     RNG/MEDQ  D:  12/24/2004  T:  12/24/2004  Job:  [id number]   cc:   Ines Bloomer, M.D.  [address]  [place]  [place] [address]   Jackquline Denmark. [name], M.D.  [address]  [place]  [place] [address]

## 2010-08-12 NOTE — Consult Note (Signed)
NAME:  Kaylee Braun, Kaylee Braun                            ACCOUNT NO.:  [id number]   MEDICAL RECORD NO.:  [id number]                   PATIENT TYPE:  OUT   LOCATION:  GYN                                  FACILITY:  [facility]   PHYSICIAN:  John T. Soper, M.D.                 DATE OF BIRTH:  [birth year]   DATE OF CONSULTATION:  06/30/2003  DATE OF DISCHARGE:                                   CONSULTATION   CHIEF COMPLAINT:  Followup of ovarian cyst.   INTERVAL HISTORY:  Since I saw the patient approximately six months ago, she  has had stable pulmonary nodule on serial chest CT scans in January and  again on June 26, 2003.  She has had another CA-125 value that was normal.  She denies pelvic or abdominal complaints, change in bowel or bladder  function.  She and her family wish conservative management if possible.   PAST MEDICAL HISTORY:  Significant for hypertension, plantar fascitis,  urinary urgency, and osteoporosis.   PAST SURGICAL HISTORY:  Benign breast lumpectomy, and traumatic pelvic/lower  extremity fracture.   MEDICATIONS:  Include atenolol, aspirin, calcium carbonate, magnesium,  multivitamins, Ditropan, Flonase, fish oil, Restoril, Fosamax, and Lacril.   ALLERGIES:  1. DAYPRO.  2. Z-PAK.  3. VIOXX.   Otherwise, my past history, personal social history, family history, and  review of systems are unchanged from those recorded during my intake  evaluation in September, 2004.   PHYSICAL EXAMINATION:  Weight is 150 pounds and vital signs stable.  Examination is deferred, per patient's request.   LABS:  CT of the chest and abdomen performed on June 26, 2003 revealed  essentially stable measurements on both the right lower lobe pulmonary  nodule and the pelvic mass.  On today's study, the right ovarian mass  measures out at 8.2 x 8.7 cm.  On immediate prior CT scan, 8.5 x 9 cm.  On  CT scan in the autumn was 8.4 x 8.7 cm.  Given the small difference, this  may represent settling or  orientation of the lesion.  There is no evidence  of ascites or retroperitoneal lymphadenopathy.  Most recent CA-125 value on  June 24, 2003 was 11.0.   ASSESSMENT:  Benign complex ovarian cyst, asymptomatic and essentially  stable on multiple scans over the past six months.  Likely benign pulmonary  nodule.   PLAN/RECOMMENDATIONS:  I had a long discussion with the patient and her  family, in excess of 15 minutes, regarding the differential diagnosis of the  ovarian cyst and indications for surgery.  These would include symptoms of  pain, change in bowel or bladder function, significant enlargement of her  adnexal mass, increasing complexity or other features to suggest malignancy.  In the absence of these, I believe it would be reasonable to continue to  observe her.   Depending on Dr. Scheryl Darter recommendations for following the  pulmonary  nodule, I would feel quite comfortable repeating imaging in approximately  six months, but if she is to undergo a CT of the chest sooner, her abdomen  and pelvis could be imaged at that time.  I would like to see her back for  followup in six months.                                               John T. [name], M.D.    JTS/MEDQ  D:  06/30/2003  T:  07/01/2003  Job:  [id number]   cc:   Ines Bloomer, M.D.  [address]  [place]  [place] [address]   Candyce Churn, M.D.  [address] [place]  [place] [address]  Fax: [phone]   Telford Nab, R.N.  [address]  [place], [place] [address]   Rozanna Boer., M.D.  [address]  [place]  [place] [address]  Fax: [phone]
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
## 2010-08-26 ENCOUNTER — Emergency Department (HOSPITAL_COMMUNITY)
Admission: EM | Admit: 2010-08-26 | Discharge: 2010-08-26 | Disposition: A | Discharge status: Home / Self Care | Payer: Medicare Other | Attending: Emergency Medicine | Admitting: Emergency Medicine

## 2010-08-26 ENCOUNTER — Emergency Department (HOSPITAL_COMMUNITY): Payer: Medicare Other

## 2010-08-26 DIAGNOSIS — M25569 Pain in unspecified knee: Secondary | ICD-10-CM | POA: Insufficient documentation

## 2010-08-26 DIAGNOSIS — E0590 Thyrotoxicosis, unspecified without thyrotoxic crisis or storm: Secondary | ICD-10-CM | POA: Insufficient documentation

## 2010-08-26 DIAGNOSIS — Z8673 Personal history of transient ischemic attack (TIA), and cerebral infarction without residual deficits: Secondary | ICD-10-CM | POA: Insufficient documentation

## 2010-08-26 DIAGNOSIS — W06XXXA Fall from bed, initial encounter: Secondary | ICD-10-CM | POA: Insufficient documentation

## 2010-08-26 DIAGNOSIS — T1490XA Injury, unspecified, initial encounter: Secondary | ICD-10-CM | POA: Insufficient documentation

## 2010-08-26 DIAGNOSIS — Z79899 Other long term (current) drug therapy: Secondary | ICD-10-CM | POA: Insufficient documentation

## 2010-08-26 DIAGNOSIS — Y9210 Unspecified residential institution as the place of occurrence of the external cause: Secondary | ICD-10-CM | POA: Insufficient documentation

## 2012-02-12 ENCOUNTER — Emergency Department (HOSPITAL_COMMUNITY): Payer: Medicare Other

## 2012-02-12 ENCOUNTER — Emergency Department (HOSPITAL_COMMUNITY)
Admission: EM | Admit: 2012-02-12 | Discharge: 2012-02-12 | Disposition: A | Discharge status: Home / Self Care | Payer: Medicare Other | Attending: Emergency Medicine | Admitting: Emergency Medicine

## 2012-02-12 ENCOUNTER — Encounter (HOSPITAL_COMMUNITY): Payer: Self-pay | Admitting: Emergency Medicine

## 2012-02-12 DIAGNOSIS — M1990 Unspecified osteoarthritis, unspecified site: Secondary | ICD-10-CM | POA: Insufficient documentation

## 2012-02-12 DIAGNOSIS — F0390 Unspecified dementia without behavioral disturbance: Secondary | ICD-10-CM | POA: Insufficient documentation

## 2012-02-12 DIAGNOSIS — E213 Hyperparathyroidism, unspecified: Secondary | ICD-10-CM | POA: Insufficient documentation

## 2012-02-12 DIAGNOSIS — Y9210 Unspecified residential institution as the place of occurrence of the external cause: Secondary | ICD-10-CM | POA: Insufficient documentation

## 2012-02-12 DIAGNOSIS — W19XXXA Unspecified fall, initial encounter: Secondary | ICD-10-CM

## 2012-02-12 DIAGNOSIS — W06XXXA Fall from bed, initial encounter: Secondary | ICD-10-CM | POA: Insufficient documentation

## 2012-02-12 DIAGNOSIS — Z8673 Personal history of transient ischemic attack (TIA), and cerebral infarction without residual deficits: Secondary | ICD-10-CM | POA: Insufficient documentation

## 2012-02-12 DIAGNOSIS — Z043 Encounter for examination and observation following other accident: Secondary | ICD-10-CM | POA: Insufficient documentation

## 2012-02-12 DIAGNOSIS — Y939 Activity, unspecified: Secondary | ICD-10-CM | POA: Insufficient documentation

## 2012-02-12 DIAGNOSIS — R1110 Vomiting, unspecified: Secondary | ICD-10-CM | POA: Insufficient documentation

## 2012-02-12 DIAGNOSIS — Z79899 Other long term (current) drug therapy: Secondary | ICD-10-CM | POA: Insufficient documentation

## 2012-02-12 DIAGNOSIS — Z993 Dependence on wheelchair: Secondary | ICD-10-CM | POA: Insufficient documentation

## 2012-02-12 HISTORY — DX: Cerebral infarction, unspecified: I63.9

## 2012-02-12 HISTORY — DX: Unspecified osteoarthritis, unspecified site: M19.90

## 2012-02-12 HISTORY — DX: Diverticulitis of intestine, part unspecified, without perforation or abscess without bleeding: K57.92

## 2012-02-12 HISTORY — DX: Hyperparathyroidism, unspecified: E21.3

## 2012-02-12 NOTE — ED Notes (Signed)
Pt brought in via EMS after fall from bed at [facility]. Pt denies pain, denies LOC, denies hitting head. Pt did vomit x1 per SNF. 20g Lt AC placed by EMS

## 2012-02-12 NOTE — ED Notes (Signed)
NAD noted at time of d/c home with daughter back to SNF.

## 2012-02-12 NOTE — ED Provider Notes (Signed)
History     CSN: [id number]  Arrival date & time 02/12/12  9629   First MD Initiated Contact with Patient 02/12/12 0920      Chief Complaint  Patient presents with  . Fall    (Consider location/radiation/quality/duration/timing/severity/associated sxs/prior treatment) HPI Comments: Pt comes in with cc of fall. Pt has hx of dementia, LEVEL 5 CAVEAT. Pt comes in after a fall. Pt lives at a nursing home, and after he breakfast, had an unwitnessed fall. Pt is aox1, but able to answer all questions appropriately. States that she fell as she slipped out of the bed. She is wheel chair bound per daughter. There is no headache, pt had an episode of emesis at nursing home per EMS. Pt has no pain in her extremities, no abd pain, no back pain.    Patient is a 78 y.o. female presenting with fall. The history is provided by the patient.  Fall Associated symptoms include vomiting. Pertinent negatives include no abdominal pain, no nausea and no headaches.    Past Medical History  Diagnosis Date  . Stroke   . Diverticulitis   . Hyperparathyroidism   . DJD (degenerative joint disease)     No past surgical history on file.  No family history on file.  History  Substance Use Topics  . Smoking status: Not on file  . Smokeless tobacco: Not on file  . Alcohol Use:     OB History    Grav Para Term Preterm Abortions TAB SAB Ect Mult Living                  Review of Systems  Constitutional: Negative for activity change.  HENT: Negative for neck pain.   Respiratory: Negative for shortness of breath.   Cardiovascular: Negative for chest pain.  Gastrointestinal: Positive for vomiting. Negative for nausea and abdominal pain.  Genitourinary: Negative for dysuria, urgency, frequency and difficulty urinating.  Neurological: Negative for headaches.  Hematological: Does not bruise/bleed easily.    Allergies  Coconut oil and Oysters  Home Medications   Current Outpatient Rx  Name   Route  Sig  Dispense  Refill  . ACETAMINOPHEN 325 MG PO TABS   Oral   Take 650 mg by mouth every 4 (four) hours as needed. For pain         . ALUM & MAG HYDROXIDE-SIMETH 200-200-20 MG/5ML PO SUSP   Oral   Take 20 mLs by mouth as needed. For heartburn -  not to exceed 3 doses in 24 hours         . CALCIUM CARBONATE-VITAMIN D 600-400 MG-UNIT PO TABS   Oral   Take 1 tablet by mouth 2 (two) times daily.         Marland Kitchen DILTIAZEM HCL ER 120 MG PO CP24   Oral   Take 120 mg by mouth daily.         . DULOXETINE HCL 30 MG PO CPEP   Oral   Take 30 mg by mouth daily.         Marland Kitchen HYDROCORTISONE 1 % EX CREA   Topical   Apply 1 application topically 2 (two) times daily as needed. For rash         . MUSCLE RUB 10-15 % EX CREA   Topical   Apply 1 application topically as needed. For legs and back pain         . MUPIROCIN 2 % EX OINT   Right Nare   Place  1 application into right nostril 2 (two) times daily as needed. For dry nares         . SALINE NASAL SPRAY 0.65 % NA SOLN   Each Nare   Place 2 sprays into both nostrils 3 (three) times daily as needed. For nasal congestion         . TRIAMCINOLONE ACETONIDE 0.1 % EX CREA   Topical   Apply 1 application topically 2 (two) times daily as needed. For rash on neck         . VALSARTAN-HYDROCHLOROTHIAZIDE 160-12.5 MG PO TABS   Oral   Take 0.5 tablets by mouth daily.           BP 130/64  Pulse 80  Temp 97.4 F (36.3 C) (Oral)  Resp 16  SpO2 99%  Physical Exam  Nursing note and vitals reviewed. Constitutional: She is oriented to person, place, and time. She appears well-developed and well-nourished.  HENT:  Head: Normocephalic and atraumatic.  Eyes: EOM are normal. Pupils are equal, round, and reactive to light.  Neck: Neck supple.       No midline c-spine tenderness, pt able to turn head to 45 degrees bilaterally without any pain and able to flex neck to the chest and extend without any pain or neurologic symptoms.    Cardiovascular: Normal rate.   Murmur heard. Pulmonary/Chest: Effort normal. No respiratory distress.  Abdominal: Soft. She exhibits no distension. There is no tenderness. There is no rebound and no guarding.  Musculoskeletal: She exhibits no edema and no tenderness.       Head to toe evaluation shows no hematoma, bleeding of the scalp, no facial abrasions, step offs, crepitus, no tenderness to palpation of the bilateral upper and lower extremities, no gross deformities, no chest tenderness, no pelvic pain.  Neurological: She is alert and oriented to person, place, and time.  Skin: Skin is warm and dry. No rash noted.    ED Course  Procedures (including critical care time)  Labs Reviewed - No data to display Ct Head Wo Contrast  02/12/2012  *RADIOLOGY REPORT*  Clinical Data: Larey Seat.  CT HEAD WITHOUT CONTRAST  Technique:  Contiguous axial images were obtained from the base of the skull through the vertex without contrast.  Comparison: 11/20/2005  Findings: The brain shows generalized atrophy with advanced chronic small vessel change throughout the pons and the cerebral hemispheric Sant matter.  There is no identifiably acute infarction, mass lesion, hemorrhage, hydrocephalus or extra-axial collection.  Certainly, an acute small vessel infarction could be hidden amongst the extensive chronic changes.  No skull fracture. No fluid in the sinuses, middle ears or mastoids.  IMPRESSION: Atrophy and advanced chronic small vessel disease, unchanged grossly since 2007.  No identifiably acute or traumatic finding.   Original Report Authenticated By: Paulina Fusi, M.D.      1. Fall       MDM  Pt comes in post fall. No complains at this time. Unwitnessed fall, but patient reports no syncope. We will get CT head, EKG. No further imaging required per clinical exam.         Derwood Kaplan, MD 02/12/12 1106

## 2012-02-12 NOTE — ED Notes (Signed)
EDP in room at this time. Daughter at bedside. Pt denies pain or discomfort

## 2012-03-20 ENCOUNTER — Encounter (HOSPITAL_BASED_OUTPATIENT_CLINIC_OR_DEPARTMENT_OTHER): Payer: Self-pay | Admitting: Emergency Medicine

## 2012-03-20 ENCOUNTER — Emergency Department (HOSPITAL_BASED_OUTPATIENT_CLINIC_OR_DEPARTMENT_OTHER): Payer: Medicare Other

## 2012-03-20 ENCOUNTER — Emergency Department (HOSPITAL_BASED_OUTPATIENT_CLINIC_OR_DEPARTMENT_OTHER)
Admission: EM | Admit: 2012-03-20 | Discharge: 2012-03-20 | Disposition: A | Discharge status: Skilled Nursing Facility | Payer: Medicare Other | Attending: Emergency Medicine | Admitting: Emergency Medicine

## 2012-03-20 DIAGNOSIS — Z8739 Personal history of other diseases of the musculoskeletal system and connective tissue: Secondary | ICD-10-CM | POA: Insufficient documentation

## 2012-03-20 DIAGNOSIS — IMO0002 Reserved for concepts with insufficient information to code with codable children: Secondary | ICD-10-CM | POA: Insufficient documentation

## 2012-03-20 DIAGNOSIS — Z862 Personal history of diseases of the blood and blood-forming organs and certain disorders involving the immune mechanism: Secondary | ICD-10-CM | POA: Insufficient documentation

## 2012-03-20 DIAGNOSIS — Z79899 Other long term (current) drug therapy: Secondary | ICD-10-CM | POA: Insufficient documentation

## 2012-03-20 DIAGNOSIS — F068 Other specified mental disorders due to known physiological condition: Secondary | ICD-10-CM | POA: Insufficient documentation

## 2012-03-20 DIAGNOSIS — W06XXXA Fall from bed, initial encounter: Secondary | ICD-10-CM | POA: Insufficient documentation

## 2012-03-20 DIAGNOSIS — Y9210 Unspecified residential institution as the place of occurrence of the external cause: Secondary | ICD-10-CM | POA: Insufficient documentation

## 2012-03-20 DIAGNOSIS — W19XXXA Unspecified fall, initial encounter: Secondary | ICD-10-CM

## 2012-03-20 DIAGNOSIS — Z8719 Personal history of other diseases of the digestive system: Secondary | ICD-10-CM | POA: Insufficient documentation

## 2012-03-20 DIAGNOSIS — S0081XA Abrasion of other part of head, initial encounter: Secondary | ICD-10-CM

## 2012-03-20 DIAGNOSIS — Z8673 Personal history of transient ischemic attack (TIA), and cerebral infarction without residual deficits: Secondary | ICD-10-CM | POA: Insufficient documentation

## 2012-03-20 DIAGNOSIS — Y939 Activity, unspecified: Secondary | ICD-10-CM | POA: Insufficient documentation

## 2012-03-20 DIAGNOSIS — Z8639 Personal history of other endocrine, nutritional and metabolic disease: Secondary | ICD-10-CM | POA: Insufficient documentation

## 2012-03-20 DIAGNOSIS — S0990XA Unspecified injury of head, initial encounter: Secondary | ICD-10-CM

## 2012-03-20 HISTORY — DX: Unspecified dementia, unspecified severity, without behavioral disturbance, psychotic disturbance, mood disturbance, and anxiety: F03.90

## 2012-03-20 MED ORDER — MUPIROCIN 2 % EX OINT: TOPICAL_OINTMENT | Freq: Three times a day (TID) | CUTANEOUS | Status: DC

## 2012-03-20 NOTE — ED Provider Notes (Signed)
History     CSN: [id number]  Arrival date & time 03/20/12  1621   First MD Initiated Contact with Patient 03/20/12 1658      Chief Complaint  Patient presents with  . Fall    (Consider location/radiation/quality/duration/timing/severity/associated sxs/prior treatment) HPI Comments: Patient with history of cva, dementia.  Was at assisted living when she rolled out of bed and struck head on the night stand.  There are abrasions to the forehead and below the left eye on the cheek bone with minimal swelling.  She denies headache or neck pain.  She denies other injury.  She is not on any blood thinners that she knows of.  No aggravating or alleviating factors.  The history is provided by the patient.    Past Medical History  Diagnosis Date  . Stroke   . Diverticulitis   . Hyperparathyroidism   . DJD (degenerative joint disease)   . Dementia     History reviewed. No pertinent past surgical history.  History reviewed. No pertinent family history.  History  Substance Use Topics  . Smoking status: Never Smoker   . Smokeless tobacco: Not on file  . Alcohol Use: No    OB History    Grav Para Term Preterm Abortions TAB SAB Ect Mult Living                  Review of Systems  All other systems reviewed and are negative.    Allergies  Coconut oil and Oysters  Home Medications   Current Outpatient Rx  Name  Route  Sig  Dispense  Refill  . ACETAMINOPHEN 325 MG PO TABS   Oral   Take 650 mg by mouth every 4 (four) hours as needed. For pain         . ALUM & MAG HYDROXIDE-SIMETH 200-200-20 MG/5ML PO SUSP   Oral   Take 20 mLs by mouth as needed. For heartburn -  not to exceed 3 doses in 24 hours         . CALCIUM CARBONATE-VITAMIN D 600-400 MG-UNIT PO TABS   Oral   Take 1 tablet by mouth 2 (two) times daily.         Marland Kitchen DILTIAZEM HCL ER 120 MG PO CP24   Oral   Take 120 mg by mouth daily.         . DULOXETINE HCL 30 MG PO CPEP   Oral   Take 30 mg by mouth  daily.         Marland Kitchen HYDROCORTISONE 1 % EX CREA   Topical   Apply 1 application topically 2 (two) times daily as needed. For rash         . MUSCLE RUB 10-15 % EX CREA   Topical   Apply 1 application topically as needed. For legs and back pain         . MUPIROCIN 2 % EX OINT   Right Nare   Place 1 application into right nostril 2 (two) times daily as needed. For dry nares         . SALINE NASAL SPRAY 0.65 % NA SOLN   Each Nare   Place 2 sprays into both nostrils 3 (three) times daily as needed. For nasal congestion         . TRIAMCINOLONE ACETONIDE 0.1 % EX CREA   Topical   Apply 1 application topically 2 (two) times daily as needed. For rash on neck         .  VALSARTAN-HYDROCHLOROTHIAZIDE 160-12.5 MG PO TABS   Oral   Take 0.5 tablets by mouth daily.           BP 125/52  Pulse 70  Temp 98 F (36.7 C) (Oral)  Resp 18  Ht 5\' 4"  (1.626 m)  Wt 124 lb (56.246 kg)  BMI 21.28 kg/m2  SpO2 99%  Physical Exam  Nursing note and vitals reviewed. Constitutional: She is oriented to person, place, and time. She appears well-developed and well-nourished. No distress.  HENT:  Head: Normocephalic.  Mouth/Throat: Oropharynx is clear and moist.       There are abrasions to the forehead and below the left eye.  There is minimal swelling and no obvious deformity.  TM's are clear bilaterally.  Eyes: EOM are normal. Pupils are equal, round, and reactive to light.  Neck: Normal range of motion. Neck supple.       No c-spine ttp or stepoffs.  Cardiovascular: Normal rate and regular rhythm.   No murmur heard. Pulmonary/Chest: Effort normal and breath sounds normal. No respiratory distress.  Musculoskeletal: Normal range of motion. She exhibits no edema.  Neurological: She is alert and oriented to person, place, and time. No cranial nerve deficit. Coordination normal.  Skin: Skin is warm and dry. She is not diaphoretic.    ED Course  Procedures (including critical care  time)  Labs Reviewed - No data to display No results found.   No diagnosis found.    MDM  The patient presents after a fall with abrasions, contusions to the forehead.  The ct is negative for acute injury.  She appears quite stable for discharge to home.  Return prn.        Geoffery Lyons, MD 03/20/12 [phone]

## 2012-03-20 NOTE — ED Notes (Addendum)
Son at bedside, reports pt. has multiple falls from bed.  She resides in assisted living and per staff, pt. had no LOC. Son states the assisted living facility normally allows him to sign a paper declining a medical evaluation, but since she has an abrasion to her cheek, they wouldn't allow him to sign today.  Son also reports her mental status is per norm.

## 2012-03-20 NOTE — ED Notes (Signed)
Pt at [facility] [employer] [facility] [employer] [employer], was in her bed, she apparently rolled off bed, pt braised night stand as she was going down and scraped forehead and check

## 2012-09-20 ENCOUNTER — Inpatient Hospital Stay (HOSPITAL_COMMUNITY)
Admission: EM | Admit: 2012-09-20 | Discharge: 2012-09-23 | DRG: 689 | Payer: Medicare Other | Attending: Internal Medicine | Admitting: Internal Medicine

## 2012-09-20 ENCOUNTER — Emergency Department (HOSPITAL_COMMUNITY): Payer: Medicare Other

## 2012-09-20 ENCOUNTER — Encounter (HOSPITAL_COMMUNITY): Payer: Self-pay | Admitting: Emergency Medicine

## 2012-09-20 DIAGNOSIS — F0280 Dementia in other diseases classified elsewhere without behavioral disturbance: Secondary | ICD-10-CM | POA: Diagnosis present

## 2012-09-20 DIAGNOSIS — E860 Dehydration: Secondary | ICD-10-CM

## 2012-09-20 DIAGNOSIS — R222 Localized swelling, mass and lump, trunk: Secondary | ICD-10-CM | POA: Diagnosis present

## 2012-09-20 DIAGNOSIS — I959 Hypotension, unspecified: Secondary | ICD-10-CM | POA: Diagnosis present

## 2012-09-20 DIAGNOSIS — R410 Disorientation, unspecified: Secondary | ICD-10-CM

## 2012-09-20 DIAGNOSIS — M1990 Unspecified osteoarthritis, unspecified site: Secondary | ICD-10-CM | POA: Diagnosis present

## 2012-09-20 DIAGNOSIS — Z8673 Personal history of transient ischemic attack (TIA), and cerebral infarction without residual deficits: Secondary | ICD-10-CM

## 2012-09-20 DIAGNOSIS — R4182 Altered mental status, unspecified: Secondary | ICD-10-CM | POA: Diagnosis present

## 2012-09-20 DIAGNOSIS — G92 Toxic encephalopathy: Secondary | ICD-10-CM | POA: Diagnosis present

## 2012-09-20 DIAGNOSIS — Z79899 Other long term (current) drug therapy: Secondary | ICD-10-CM

## 2012-09-20 DIAGNOSIS — N390 Urinary tract infection, site not specified: Principal | ICD-10-CM | POA: Diagnosis present

## 2012-09-20 DIAGNOSIS — R918 Other nonspecific abnormal finding of lung field: Secondary | ICD-10-CM | POA: Diagnosis present

## 2012-09-20 DIAGNOSIS — G929 Unspecified toxic encephalopathy: Secondary | ICD-10-CM | POA: Diagnosis present

## 2012-09-20 DIAGNOSIS — R7989 Other specified abnormal findings of blood chemistry: Secondary | ICD-10-CM | POA: Diagnosis present

## 2012-09-20 DIAGNOSIS — E869 Volume depletion, unspecified: Secondary | ICD-10-CM | POA: Diagnosis present

## 2012-09-20 DIAGNOSIS — B9689 Other specified bacterial agents as the cause of diseases classified elsewhere: Secondary | ICD-10-CM | POA: Diagnosis present

## 2012-09-20 LAB — GLUCOSE, CAPILLARY: Glucose-Capillary: 101 mg/dL — ABNORMAL HIGH (ref 70–99)

## 2012-09-20 LAB — DIFFERENTIAL
Basophils Absolute: 0 10*3/uL (ref 0.0–0.1)
Basophils Relative: 0 % (ref 0–1)
Lymphocytes Relative: 13 % (ref 12–46)
Monocytes Relative: 10 % (ref 3–12)
Neutro Abs: 5.3 10*3/uL (ref 1.7–7.7)
Neutrophils Relative %: 78 % — ABNORMAL HIGH (ref 43–77)

## 2012-09-20 LAB — COMPREHENSIVE METABOLIC PANEL
AST: 12 U/L (ref 0–37)
Albumin: 3.2 g/dL — ABNORMAL LOW (ref 3.5–5.2)
Alkaline Phosphatase: 56 U/L (ref 39–117)
BUN: 29 mg/dL — ABNORMAL HIGH (ref 6–23)
CO2: 26 mEq/L (ref 19–32)
Chloride: 105 mEq/L (ref 96–112)
GFR calc non Af Amer: 36 mL/min — ABNORMAL LOW (ref >90)
Potassium: 4.3 mEq/L (ref 3.5–5.1)
Total Bilirubin: 0.2 mg/dL — ABNORMAL LOW (ref 0.3–1.2)

## 2012-09-20 LAB — RAPID URINE DRUG SCREEN, HOSP PERFORMED: Benzodiazepines: NOT DETECTED

## 2012-09-20 LAB — CBC
HCT: 37 % (ref 36.0–46.0)
Hemoglobin: 12.3 g/dL (ref 12.0–15.0)
MCHC: 33.2 g/dL (ref 30.0–36.0)
MCV: 88.4 fL (ref 78.0–100.0)
Platelets: 122 10*3/uL — ABNORMAL LOW (ref 150–400)
RBC: 4.18 MIL/uL (ref 3.87–5.11)
RDW: 13.9 % (ref 11.5–15.5)
WBC: 6.9 10*3/uL (ref 4.0–10.5)
WBC: 9.2 10*3/uL (ref 4.0–10.5)

## 2012-09-20 LAB — URINE MICROSCOPIC-ADD ON

## 2012-09-20 LAB — URINALYSIS, ROUTINE W REFLEX MICROSCOPIC
Bilirubin Urine: NEGATIVE
Glucose, UA: NEGATIVE mg/dL
Ketones, ur: NEGATIVE mg/dL
pH: 5 (ref 5.0–8.0)

## 2012-09-20 LAB — CREATININE, SERUM: GFR calc Af Amer: 55 mL/min — ABNORMAL LOW (ref >90)

## 2012-09-20 MED ORDER — TRIAMCINOLONE ACETONIDE 0.1 % EX CREA
1.0000 "application " | TOPICAL_CREAM | Freq: Two times a day (BID) | CUTANEOUS | Status: DC | PRN
  Filled 2012-09-20: qty 15

## 2012-09-20 MED ORDER — SALINE SPRAY 0.65 % NA SOLN
2.0000 | Freq: Three times a day (TID) | NASAL | Status: DC | PRN
  Filled 2012-09-20: qty 44

## 2012-09-20 MED ORDER — ACETAMINOPHEN 650 MG RE SUPP: 650.0000 mg | Freq: Four times a day (QID) | RECTAL | Status: DC | PRN

## 2012-09-20 MED ORDER — SODIUM CHLORIDE 0.9 % IV BOLUS (SEPSIS)
500.0000 mL | Freq: Once | INTRAVENOUS | Status: AC
  Administered 2012-09-20: 500 mL via INTRAVENOUS

## 2012-09-20 MED ORDER — ASPIRIN EC 81 MG PO TBEC
81.0000 mg | DELAYED_RELEASE_TABLET | Freq: Every day | ORAL | Status: DC
  Administered 2012-09-21 – 2012-09-22 (×2): 81 mg via ORAL
  Filled 2012-09-20 (×4): qty 1

## 2012-09-20 MED ORDER — CEFTRIAXONE SODIUM 1 G IJ SOLR
1.0000 g | Freq: Every day | INTRAMUSCULAR | Status: DC
  Administered 2012-09-21 – 2012-09-22 (×2): 1 g via INTRAVENOUS
  Filled 2012-09-20 (×2): qty 10

## 2012-09-20 MED ORDER — DULOXETINE HCL 30 MG PO CPEP
30.0000 mg | ORAL_CAPSULE | Freq: Every day | ORAL | Status: DC
  Administered 2012-09-21 – 2012-09-22 (×2): 30 mg via ORAL
  Filled 2012-09-20 (×3): qty 1

## 2012-09-20 MED ORDER — ENOXAPARIN SODIUM 30 MG/0.3ML ~~LOC~~ SOLN
30.0000 mg | Freq: Every day | SUBCUTANEOUS | Status: DC
  Administered 2012-09-20 – 2012-09-22 (×3): 30 mg via SUBCUTANEOUS
  Filled 2012-09-20 (×4): qty 0.3

## 2012-09-20 MED ORDER — SALINE NASAL SPRAY 0.65 % NA SOLN: 2.0000 | Freq: Three times a day (TID) | NASAL | Status: DC | PRN

## 2012-09-20 MED ORDER — ONDANSETRON HCL 4 MG/2ML IJ SOLN
4.0000 mg | Freq: Four times a day (QID) | INTRAMUSCULAR | Status: DC | PRN
  Administered 2012-09-21: 4 mg via INTRAVENOUS
  Filled 2012-09-20: qty 2

## 2012-09-20 MED ORDER — NALOXONE HCL 0.4 MG/ML IJ SOLN
0.4000 mg | Freq: Once | INTRAMUSCULAR | Status: AC
  Administered 2012-09-20: 0.4 mg via INTRAVENOUS
  Filled 2012-09-20: qty 1

## 2012-09-20 MED ORDER — ACETAMINOPHEN 325 MG PO TABS
650.0000 mg | ORAL_TABLET | ORAL | Status: DC | PRN
  Filled 2012-09-20 (×2): qty 2

## 2012-09-20 MED ORDER — SODIUM CHLORIDE 0.9 % IV SOLN
INTRAVENOUS | Status: DC
  Administered 2012-09-20 – 2012-09-22 (×2): via INTRAVENOUS

## 2012-09-20 MED ORDER — ACETAMINOPHEN 325 MG PO TABS
650.0000 mg | ORAL_TABLET | Freq: Four times a day (QID) | ORAL | Status: DC | PRN
  Administered 2012-09-21 – 2012-09-22 (×2): 650 mg via ORAL

## 2012-09-20 MED ORDER — ONDANSETRON HCL 4 MG PO TABS: 4.0000 mg | ORAL_TABLET | Freq: Four times a day (QID) | ORAL | Status: DC | PRN

## 2012-09-20 MED ORDER — DILTIAZEM HCL ER 120 MG PO CP24
120.0000 mg | ORAL_CAPSULE | Freq: Every day | ORAL | Status: DC
  Administered 2012-09-21 – 2012-09-22 (×2): 120 mg via ORAL
  Filled 2012-09-20 (×3): qty 1

## 2012-09-20 MED ORDER — DEXTROSE 5 % IV SOLN
1.0000 g | Freq: Once | INTRAVENOUS | Status: AC
  Administered 2012-09-20: 1 g via INTRAVENOUS
  Filled 2012-09-20: qty 10

## 2012-09-20 MED ORDER — SODIUM CHLORIDE 0.9 % IJ SOLN
3.0000 mL | Freq: Two times a day (BID) | INTRAMUSCULAR | Status: DC
  Administered 2012-09-20 – 2012-09-21 (×2): 3 mL via INTRAVENOUS

## 2012-09-20 NOTE — ED Notes (Signed)
Pt from [facility] ALF. Found by staff this morning hypotensive "wasn't acting like self, didn't look like self" per staff at alf. lsn last night at bedtime. 70/42 initial BP, last check 128/60. NSR with PVCs about every 6 beats. 18g R ac. Hx cva - some L side deficits remain.

## 2012-09-20 NOTE — Clinical Documentation Improvement (Signed)
THIS DOCUMENT IS NOT A PERMANENT PART OF THE MEDICAL RECORD  Please update your documentation with the medical record to reflect your response to this query. If you need help knowing how to do this please call [phone].  I have included toxic metabolic encephalopathy in my H&P as this was my only encounter with the pt that I can addend.  09/20/12  Dear Dr. Suanne Marker Marton Redwood  In an effort to better capture your patient's severity of illness, reflect appropriate length of stay and utilization of resources, a review of the patient medical record has revealed the following indicators.    Based on your clinical judgment, please clarify and document in a progress note and/or discharge summary the clinical condition associated with the following supporting information:  In responding to this query please exercise your independent judgment.  The fact that a query is asked, does not imply that any particular answer is desired or expected.    Possible Clinical Conditions?  _______Encephalopathy (describe type if known)                       Anoxic                       Septic                       Alcoholic                        Hepatic                       Hypertensive                       Metabolic                       Toxic _______Traumatic brain injury _______Traumatic brain hemorrhage _______Drug induced confusion/delirium _______Acute confusion _______Acute delirium _______Acute exacerbation of known dementia (indicate type) _______New diagnosis of Dementia, Alzheimer's, cerebral atherosclerosis _______Other Condition _______Cannot Clinically Determine       Risk Factors: Per 6/27 ED note, patient presents with AMS.    Reviewed: additional documentation in the medical record  Thank You,  Marciano Sequin,  Clinical Documentation Specialist: [phone] Health Information Management [facility]

## 2012-09-20 NOTE — ED Provider Notes (Signed)
History    CSN: [id number] Arrival date & time 09/20/12  4098  First MD Initiated Contact with Patient 09/20/12 [phone]     Chief Complaint  Patient presents with  . Hypotension  . Fatigue    Patient is a 79 y.o. female presenting with altered mental status. The history is provided by the patient, the nursing home, the EMS personnel and a relative. The history is limited by the condition of the patient.  Altered Mental Status Presenting symptoms: lethargy   Severity:  Moderate Most recent episode:  Yesterday Timing:  Constant Progression:  Worsening Chronicity:  New Context: not head injury   Associated symptoms: no fever    Past Medical History  Diagnosis Date  . Stroke   . Diverticulitis   . Hyperparathyroidism   . DJD (degenerative joint disease)   . Dementia    History reviewed. No pertinent past surgical history. No family history on file. History  Substance Use Topics  . Smoking status: Never Smoker   . Smokeless tobacco: Not on file  . Alcohol Use: No   OB History   Grav Para Term Preterm Abortions TAB SAB Ect Mult Living                 Review of Systems  Unable to perform ROS: Mental status change  Constitutional: Negative for fever.  Psychiatric/Behavioral: Positive for altered mental status.    Allergies  Coconut oil and Oysters  Home Medications   Current Outpatient Rx  Name  Route  Sig  Dispense  Refill  . acetaminophen (TYLENOL) 325 MG tablet   Oral   Take 650 mg by mouth every 4 (four) hours as needed. For pain         . alum & mag hydroxide-simeth (MINTOX) 200-200-20 MG/5ML suspension   Oral   Take 20 mLs by mouth as needed. For heartburn -  not to exceed 3 doses in 24 hours         . Calcium Carbonate-Vitamin D (CALTRATE 600+D) 600-400 MG-UNIT per tablet   Oral   Take 1 tablet by mouth 2 (two) times daily.         Marland Kitchen diltiazem (DILACOR XR) 120 MG 24 hr capsule   Oral   Take 120 mg by mouth daily.         . DULoxetine  (CYMBALTA) 30 MG capsule   Oral   Take 30 mg by mouth daily.         . hydrocortisone cream 1 %   Topical   Apply 1 application topically 2 (two) times daily as needed. For rash         . Menthol-Methyl Salicylate (MUSCLE RUB) 10-15 % CREA   Topical   Apply 1 application topically as needed. For legs and back pain         . sodium chloride (OCEAN) 0.65 % nasal spray   Each Nare   Place 2 sprays into both nostrils 3 (three) times daily as needed. For nasal congestion         . triamcinolone cream (KENALOG) 0.1 %   Topical   Apply 1 application topically 2 (two) times daily as needed. For rash on neck         . valsartan-hydrochlorothiazide (DIOVAN-HCT) 160-12.5 MG per tablet   Oral   Take 0.5 tablets by mouth daily.          BP 119/53  Pulse 66  Resp 17  SpO2 98% Physical Exam CONSTITUTIONAL:  Well developed/well nourished HEAD: Normocephalic/atraumatic EYES: EOMI/PERRL ENMT: Mucous membranes dry NECK: supple no meningeal signs SPINE:entire spine nontender CV: S1/S2 noted, no murmurs/rubs/gallops noted LUNGS: Lungs are clear to auscultation bilaterally, no apparent distress ABDOMEN: soft, nontender, no rebound or guarding GU:no cva tenderness NEURO: Pt is somnolent but arousable.  Left facial droop noted.  No arm drift noted.  She drifts back to sleep following exam EXTREMITIES: pulses normal, full ROM. No signs of trauma or deformity to extremities SKIN: warm, color normal PSYCH: somnolent  ED Course  Procedures Labs Reviewed  GLUCOSE, CAPILLARY - Abnormal; Notable for the following:    Glucose-Capillary 101 (*)    All other components within normal limits  CBC  DIFFERENTIAL  COMPREHENSIVE METABOLIC PANEL  TROPONIN I  URINALYSIS, ROUTINE W REFLEX MICROSCOPIC  URINE RAPID DRUG SCREEN (HOSP PERFORMED)  CG4 I-STAT (LACTIC ACID)   Dg Chest Portable 1 View  09/20/2012   *RADIOLOGY REPORT*  Clinical Data: Hypotension, fatigue  PORTABLE CHEST - 1 VIEW   Comparison: Portable exam 1016 hours compared to 11/20/2005  Findings: Upper normal heart size. Atherosclerotic calcification aorta. Mediastinal contours and pulmonary vascularity normal. Changes of COPD with bibasilar atelectasis. Mass identified inferior right chest, 5.0 x 4.1 cm highly suspicious for neoplasm. No pleural effusion or pneumothorax. Skin folds project over left chest. Bones diffusely demineralized with bilateral glenohumeral degenerative changes and minimal degenerative changes thoracic spine.  IMPRESSION: COPD with bibasilar atelectasis. Inferior right hemithorax mass-like opacity 5.0 x 4.1 cm highly suspicious for pulmonary neoplasm. CT chest with contrast recommended for further evaluation.   Original Report Authenticated By: Ulyses Southward, M.D.   10:51 AM Pt presents from assisted living for AMS I spoke to facility, they report she is usually awake/alert, and last time she was at baseline was yesterday No change in meds noted.  This morning her BP was low and given IV fluids by EMS and this improved I spoke to daughter and she reports noticing lethargy yesterday Per chart, she is a full code. Per daughter she does have h/o left facial droop at baseline She is not on anticoagulants per daughter 12:10 PM uti noted Pt otherwise stable without hypotension She is somnolent but easily arousable Abnormal CXR noted, d/w daughter concerning need for followup D/w [facility]ad to admit to tele for now  MDM  Nursing notes including past medical history and social history reviewed and considered in documentation xrays reviewed and considered Labs/vital reviewed and considered     Date: 09/20/2012  Rate: 68  Rhythm: normal sinus rhythm  QRS Axis: left  Intervals: normal  ST/T Wave abnormalities: nonspecific ST changes  Conduction Disutrbances:none  Narrative Interpretation:   Old EKG Reviewed: unchanged     Joya Gaskins, MD 09/20/12 1210

## 2012-09-20 NOTE — H&P (Addendum)
[facility] History and Physical  Kaylee Braun:[id number] DOB: 28-Nov-1921 DOA: 09/20/2012  Referring physician: Dr. Bebe Shaggy PCP: No primary provider on file.  Specialists:   Chief Complaint: AMS  HPI: Kaylee Braun is a 79 y.o. female ALF resident with history of CVA, dementia brought in by EMS with altered mental status/lethargy. Her daughter is at the bedside assisting with the history and states that last p.m. when she saw her she was not acting like her usual self-and she notified staff at that point and her vitals were checked and her blood pressure was fine. This morning she states she was called by staff was noted that the patient needed to be brought to the ED. Per EMS ALF staff found the patient to be hypotensive-initially with systolic in the 70s, and was lethargic. She was bolused with IV fluids and on recheck her blood pressure improved to 128/60. Upon arrival in the ED blood pressures remained stable, urinalysis was consistent with a UTI, lactic acid level was within normal limits at 1.2 to CT scan of head showed no acute findings, and a chest x-ray showed an inferior right hemithorax masslike opacity 5 x 4.1 cm highly suspicious for pulmonary neoplasm. She is admitted for further evaluation and management.   Review of Systems: The patient denies anorexia, fever, weight loss,, vision loss, decreased hearing, hoarseness, chest pain, syncope, dyspnea on exertion, peripheral edema, balance deficits, hemoptysis, abdominal pain, melena, hematochezia, severe indigestion/heartburn, hematuriatransient blindness, difficulty walking, depression, unusual weight change, abnormal bleeding,    Past Medical History  Diagnosis Date  . Stroke   . Diverticulitis   . Hyperparathyroidism   . DJD (degenerative joint disease)   . Dementia    History reviewed. No pertinent past surgical history. Social History:  reports that she has never smoked. She does not have any smokeless tobacco  history on file. She reports that she does not drink alcohol. Her drug history is not on file.  where does patient live--SNF   Allergies  Allergen Reactions  . Coconut Oil     NH MAR  . Oysters (Shellfish Allergy)     NH MAR    Family history-unable to obtain secondary to ams  Prior to Admission medications   Medication Sig Start Date End Date Taking? Authorizing Provider  acetaminophen (TYLENOL) 325 MG tablet Take 650 mg by mouth every 4 (four) hours as needed. For pain   Yes Historical Provider, MD  alum & mag hydroxide-simeth (MINTOX) 200-200-20 MG/5ML suspension Take 20 mLs by mouth as needed. For heartburn -  not to exceed 3 doses in 24 hours   Yes Historical Provider, MD  Calcium Carbonate-Vitamin D (CALTRATE 600+D) 600-400 MG-UNIT per tablet Take 1 tablet by mouth 2 (two) times daily.   Yes Historical Provider, MD  diltiazem (DILACOR XR) 120 MG 24 hr capsule Take 120 mg by mouth daily.   Yes Historical Provider, MD  DULoxetine (CYMBALTA) 30 MG capsule Take 30 mg by mouth daily.   Yes Historical Provider, MD  hydrocortisone cream 1 % Apply 1 application topically 2 (two) times daily as needed. For rash   Yes Historical Provider, MD  Menthol-Methyl Salicylate (MUSCLE RUB) 10-15 % CREA Apply 1 application topically as needed. For legs and back pain   Yes Historical Provider, MD  sodium chloride (OCEAN) 0.65 % nasal spray Place 2 sprays into both nostrils 3 (three) times daily as needed. For nasal congestion   Yes Historical Provider, MD  triamcinolone cream (KENALOG) 0.1 %  Apply 1 application topically 2 (two) times daily as needed. For rash on neck   Yes Historical Provider, MD  valsartan-hydrochlorothiazide (DIOVAN-HCT) 160-12.5 MG per tablet Take 0.5 tablets by mouth daily.   Yes Historical Provider, MD   Physical Exam: Filed Vitals:   09/20/12 1200 09/20/12 1230 09/20/12 1245 09/20/12 1320  BP: 130/57 135/49 134/62 147/68  Pulse: 60 70 63 54  Temp:    97.4 F (36.3 C)   TempSrc:    Oral  Resp: 17 15 17 16   Height:    5\' 4"  (1.626 m)  Weight:    52.4 kg (115 lb 8.3 oz)  SpO2: 100% 97% 98% 98%    Constitutional: Vital signs reviewed.  Patient is a well-developed and well-nourished in no respiratory acute distress. Lethargic, rouses to voice. Head: Normocephalic and atraumatic Nose: No erythema or drainage noted.   Mouth: no erythema or exudates, slightly dry mucous membranes Eyes: PERRL, EOMI, conjunctivae normal, No scleral icterus.  Neck: Supple, Trachea midline normal ROM, No JVD, mass, thyromegaly, or carotid bruit present.  Cardiovascular: RRR, S1 normal, S2 normal, no MRG, pulses symmetric and intact bilaterally Pulmonary/Chest: normal respiratory effort, CTAB, no wheezes, rales, or rhonchi Abdominal: Soft. Non-tender, non-distended, bowel sounds are normal, no masses, organomegaly, or guarding present.  GU: no CVA tenderness  Extremities: No cyanosis and no edema Hematology: no cervical, inginal, or axillary adenopathy.  Neurological: Lethargic, moving all extremities grossly, cranial nerve II-XII are grossly intact, sensory grossly intact, left facial droop present (from old CVA) Skin: Warm, dry and intact. No rash, cyanosis, or clubbing.    Labs on Admission:  Basic Metabolic Panel:  Recent Labs Lab 09/20/12 0955  NA 139  K 4.3  CL 105  CO2 26  GLUCOSE 103*  BUN 29*  CREATININE 1.27*  CALCIUM 9.6   Liver Function Tests:  Recent Labs Lab 09/20/12 0955  AST 12  ALT 8  ALKPHOS 56  BILITOT 0.2*  PROT 6.5  ALBUMIN 3.2*   No results found for this basename: LIPASE, AMYLASE,  in the last 168 hours No results found for this basename: AMMONIA,  in the last 168 hours CBC:  Recent Labs Lab 09/20/12 0955  WBC 6.9  NEUTROABS 5.3  HGB 12.3  HCT 37.0  MCV 88.5  PLT 148*   Cardiac Enzymes:  Recent Labs Lab 09/20/12 0955  TROPONINI <0.30    BNP (last 3 results) No results found for this basename: PROBNP,  in the last  8760 hours CBG:  Recent Labs Lab 09/20/12 0957  GLUCAP 101*    Radiological Exams on Admission: Ct Head Wo Contrast  09/20/2012   *RADIOLOGY REPORT*  Clinical Data: Brought to the the from a skilled nursing facility due to old unresponsiveness, history of CVA with residual left- sided deficits.  CT HEAD WITHOUT CONTRAST  Technique:  Contiguous axial images were obtained from the base of the skull through the vertex without contrast.  Comparison: Prior CT from 03/20/2012  Findings: The is prominence of the CSF containing spaces was consistent with generalized atrophy, unchanged.  Confluent and scattered hypodensity within the periventricular deep Faulkner matter is consistent with chronic small vessel ischemic changes, also stable.  No acute intracranial hemorrhage or infarct is identified. There is no midline shift or mass lesion.  No extra-axial fluid collection.  Calvarium is intact.  Visualized sinuses are clear.  IMPRESSION: Stable appearance the brain with extensive atrophy and chronic small vessel ischemic changes.  No acute intracranial process.  Original Report Authenticated By: Rise Mu, M.D.   Dg Chest Portable 1 View  09/20/2012   *RADIOLOGY REPORT*  Clinical Data: Hypotension, fatigue  PORTABLE CHEST - 1 VIEW  Comparison: Portable exam 1016 hours compared to 11/20/2005  Findings: Upper normal heart size. Atherosclerotic calcification aorta. Mediastinal contours and pulmonary vascularity normal. Changes of COPD with bibasilar atelectasis. Mass identified inferior right chest, 5.0 x 4.1 cm highly suspicious for neoplasm. No pleural effusion or pneumothorax. Skin folds project over left chest. Bones diffusely demineralized with bilateral glenohumeral degenerative changes and minimal degenerative changes thoracic spine.  IMPRESSION: COPD with bibasilar atelectasis. Inferior right hemithorax mass-like opacity 5.0 x 4.1 cm highly suspicious for pulmonary neoplasm. CT chest with contrast  recommended for further evaluation.   Original Report Authenticated By: Ulyses Southward, M.D.      Assessment/Plan Active Problems:   UTI (lower urinary tract infection) -As discussed above, urinalysis consistent with UTI, we'll obtain urine cultures -continue empiric antibiotics with Rocephin and follow   Volume depletion/azotemia -Hydrate follow and recheck, will hold ARB/HCTZ for now follow resume when appropriate   Altered mental status/ Toxic encephalopathy -Likely secondary to above in patient with baseline dementia -Treat as above and follow, consider for the evaluation pending clinical course Lung mass -Follow and obtain CT of chest with contrast in a.m. to further evaluate History of CVA  Dementia due to general medical condition without behavioral disturbance    Code Status: full Family Communication: daughter at bedside Disposition Plan: admit to tele  Time spent: >36mins  Kela Millin [facility] Pager [phone]  If 7PM-7AM, please contact night-coverage [email or web address] Password [facility] 09/20/2012, 2:58 PM

## 2012-09-20 NOTE — ED Notes (Signed)
CBG 101 

## 2012-09-20 NOTE — ED Notes (Signed)
Lactic acid results called to Dr. Bebe Shaggy

## 2012-09-20 NOTE — Progress Notes (Signed)
Utilization Review Completed.Kaylee Braun T6/27/2014  

## 2012-09-21 DIAGNOSIS — R222 Localized swelling, mass and lump, trunk: Secondary | ICD-10-CM

## 2012-09-21 DIAGNOSIS — N390 Urinary tract infection, site not specified: Principal | ICD-10-CM

## 2012-09-21 DIAGNOSIS — E860 Dehydration: Secondary | ICD-10-CM

## 2012-09-21 LAB — CBC
HCT: 35.9 % — ABNORMAL LOW (ref 36.0–46.0)
Hemoglobin: 12 g/dL (ref 12.0–15.0)
MCHC: 33.4 g/dL (ref 30.0–36.0)
RBC: 4.07 MIL/uL (ref 3.87–5.11)

## 2012-09-21 LAB — BASIC METABOLIC PANEL
BUN: 21 mg/dL (ref 6–23)
CO2: 26 mEq/L (ref 19–32)
Chloride: 109 mEq/L (ref 96–112)
GFR calc non Af Amer: 47 mL/min — ABNORMAL LOW (ref >90)
Glucose, Bld: 84 mg/dL (ref 70–99)
Potassium: 3.7 mEq/L (ref 3.5–5.1)

## 2012-09-21 LAB — TROPONIN I: Troponin I: <0.3 ng/mL (ref <0.30)

## 2012-09-21 MED ORDER — RESOURCE THICKENUP CLEAR PO POWD
ORAL | Status: DC | PRN
  Filled 2012-09-21: qty 125

## 2012-09-21 NOTE — Progress Notes (Signed)
Patient's daughter concerned with patient's face being flushed.  No further redness noted other than around nose and cheeks. Patient previously stated she was "hot" and was pushing four blankets off of her. Temp 98.1. BP stable. Will continue to monitor. Mamie Levers

## 2012-09-21 NOTE — Progress Notes (Signed)
Patient incontinent of urine. Patient cleaned and depends changed. Patient feels warmer to touch. Temperature now 99.0 orally. Patient states she feels okay just mild pain. Tylenol x2 po given. Call bell and bed alarm in place. Will continue to monitor. Mamie Levers

## 2012-09-21 NOTE — Progress Notes (Signed)
Patient resting quietly without signs of distress or discomfort.  Respirations are non labored.  Skin warm and dry to touch.  IVF continued without S/O infiltration.  Call bell within reach.   Kelli Hope M

## 2012-09-21 NOTE — Progress Notes (Signed)
Patient easily aroused this morning.  Denies discomfort.  More alert and talkative.  Patient was incontinent of urine and expelled a large BM while being changed and dried.   Skin warm and dry.  Mucous membranes moist.  IVF continued and infusing into R AC without s/o swelling, redness or irritation.  Call bell within reach.    Kaylee Braun

## 2012-09-21 NOTE — Progress Notes (Signed)
Subjective: Kaylee Braun, an elderly woman, has been admitted with UTI, mental status changes and hypotension on admission.  Kaylee Braun is awake and asking for something to drink. She denies pain or discomfort. Seems pleasantly demented  Objective: Lab:  Recent Labs  09/20/12 0955 09/20/12 1627 09/21/12 0211  WBC 6.9 9.2 8.0  NEUTROABS 5.3  --   --   HGB 12.3 13.1 12.0  HCT 37.0 39.5 35.9*  MCV 88.5 88.4 88.2  PLT 148* 122* 136*    Recent Labs  09/20/12 0955 09/20/12 1627 09/21/12 0211  NA 139  --  142  K 4.3  --  3.7  CL 105  --  109  GLUCOSE 103*  --  84  BUN 29*  --  21  CREATININE 1.27* 1.01 1.02  CALCIUM 9.6  --  9.1    Imaging: CXR 6/27: IMPRESSION:  COPD with bibasilar atelectasis.  Inferior right hemithorax mass-like opacity 5.0 x 4.1 cm highly  suspicious for pulmonary neoplasm.  CT chest with contrast recommended for further evaluation.  CT brain 6/27: IMPRESSION:  Stable appearance the brain with extensive atrophy and chronic  small vessel ischemic changes. No acute intracranial process.    Scheduled Meds: . aspirin EC  81 mg Oral Daily  . cefTRIAXone (ROCEPHIN)  IV  1 g Intravenous Daily  . diltiazem  120 mg Oral Daily  . DULoxetine  30 mg Oral Daily  . enoxaparin (LOVENOX) injection  30 mg Subcutaneous QHS  . sodium chloride  3 mL Intravenous Q12H   Continuous Infusions: . sodium chloride 100 mL/hr at 09/20/12 1506   PRN Meds:.acetaminophen, acetaminophen, acetaminophen, ondansetron (ZOFRAN) IV, ondansetron, sodium chloride, triamcinolone cream   Physical Exam: Filed Vitals:   09/21/12 0945  BP: 156/63  Pulse:   Temp:   Resp:     Intake/Output Summary (Last 24 hours) at 09/21/12 1102 Last data filed at 09/20/12 2208  Gross per 24 hour  Intake      3 ml  Output      0 ml  Net      3 ml   Gen'l - Elderly [place] woman in no distress Cor- RRR PUlm - normal respirations Abd- BS+ , soft, no guarding or rebound Neuro - Awake, seems  demented      Assessment/Plan: 1. UTI with MS changes - afebrile, BP stable. Urine Cx - pending.  Day #2 Rocephin  Plan Continue rocephin  CBC in AM  2. Lung mass - CT chest pending  3. F/E/N - will advance diet to regular diet - aspiration precautions.   Illene Regulus  IM (o) [phone]; (c) [phone] Call-grp - [facility]  Tele: [phone]  09/21/2012, 10:47 AM

## 2012-09-22 ENCOUNTER — Inpatient Hospital Stay (HOSPITAL_COMMUNITY): Payer: Medicare Other

## 2012-09-22 LAB — URINE CULTURE

## 2012-09-22 MED ORDER — CEFUROXIME AXETIL 250 MG PO TABS
250.0000 mg | ORAL_TABLET | Freq: Two times a day (BID) | ORAL | Status: DC
  Administered 2012-09-23: 250 mg via ORAL
  Filled 2012-09-22 (×4): qty 1

## 2012-09-22 NOTE — Progress Notes (Signed)
Clinical Social Work Department BRIEF PSYCHOSOCIAL ASSESSMENT 09/22/2012  Patient:  Kaylee Braun, Kaylee Braun     Account Number:  [id number]     Admit date:  09/20/2012  Clinical Social Worker:  Dennison Bulla  Date/Time:  09/22/2012 09:30 AM  Referred by:  Physician  Date Referred:  09/22/2012 Referred for  ALF Placement   Other Referral:   Interview type:  Family Other interview type:    PSYCHOSOCIAL DATA Living Status:  FACILITY Admitted from facility:  [facility] Level of care:  Assisted Living Primary support name:  Rinaldo Cloud Primary support relationship to patient:  CHILD, ADULT Degree of support available:   Strong    CURRENT CONCERNS Current Concerns  Post-Acute Placement   Other Concerns:    SOCIAL WORK ASSESSMENT / PLAN CSW received referral to assess patient due to being admitted from ALF. CSW reviewed chart and attempted to meet with patient but patient unable to participate in assessment. CSW called and spoke with dtr via phone.    CSW introduced myself and explained role. Dtr reports that patient has been at [facility] for the past 6 years and dtr wants her to return back "home" to ALF at [facility]. Dtr reports that she is struggling with decisions for patient because patient is her best friend and it is difficult to see patient in pain. CSW validated dtr's feelings and praised her for taking good care of patient. CSW provided dtr with CSW contact information for today if dtr needs further support.    CSW completed FL2 and placed in chart for MD signature. CSW called ALF and left a message regarding if patient could return at DC.   Assessment/plan status:  Psychosocial Support/Ongoing Assessment of Needs Other assessment/ plan:   Information/referral to community resources:   Will return to ALF    PATIENT'S/FAMILY'S RESPONSE TO PLAN OF CARE: Patient unable to participate in assessment due to dementia. Dtr appreciative of call and agreeable to DC plans.  Dtr thanked CSW for phone call.       [employer]

## 2012-09-22 NOTE — Progress Notes (Signed)
Subjective: Awake and alert and very pleasant in no distress. Daughter is at bedside  Objective: Lab:  Recent Labs  09/20/12 0955 09/20/12 1627 09/21/12 0211  WBC 6.9 9.2 8.0  NEUTROABS 5.3  --   --   HGB 12.3 13.1 12.0  HCT 37.0 39.5 35.9*  MCV 88.5 88.4 88.2  PLT 148* 122* 136*    Recent Labs  09/20/12 0955 09/20/12 1627 09/21/12 0211  NA 139  --  142  K 4.3  --  3.7  CL 105  --  109  GLUCOSE 103*  --  84  BUN 29*  --  21  CREATININE 1.27* 1.01 1.02  CALCIUM 9.6  --  9.1    Imaging:  Scheduled Meds: . aspirin EC  81 mg Oral Daily  . cefTRIAXone (ROCEPHIN)  IV  1 g Intravenous Daily  . diltiazem  120 mg Oral Daily  . DULoxetine  30 mg Oral Daily  . enoxaparin (LOVENOX) injection  30 mg Subcutaneous QHS  . sodium chloride  3 mL Intravenous Q12H   Continuous Infusions: . sodium chloride 100 mL/hr at 09/20/12 1506   PRN Meds:.acetaminophen, acetaminophen, acetaminophen, ondansetron (ZOFRAN) IV, ondansetron, RESOURCE THICKENUP CLEAR, sodium chloride, triamcinolone cream   Physical Exam: Filed Vitals:   09/22/12 1115  BP: 120/58  Pulse:   Temp:   Resp:     Intake/Output Summary (Last 24 hours) at 09/22/12 1126 Last data filed at 09/21/12 1700  Gross per 24 hour  Intake    240 ml  Output      1 ml  Net    239 ml   Gen'l- elderly demented Kaylee Braun in no distress Cor- RRR PUlm - normal respiration Abd - soft      Assessment/Plan: 1. UTI - Day #3 rocephin. Plan Change to ceftin po  2. Lung mass - CT wasn't ordered Plan - CT chest w/o contrast today  3. F/E/N doing well with diet  Dr. Kevan Ny please call Kaylee Braun's son - he says you have his number   Kaylee Braun IM (o) [phone]; (c) [phone] Call-grp - Patsi Sears IM  Tele: [phone]  09/22/2012, 11:20 AM

## 2012-09-22 NOTE — Progress Notes (Signed)
Patient much more alert today. Able to state name, birthday, date, and place. Patient sitting upright for breakfast. Able feed self with supervision. Call bell near. Will continue to monitor. Kaylee Braun

## 2012-09-23 MED ORDER — CEFUROXIME AXETIL 250 MG PO TABS: 250.0000 mg | ORAL_TABLET | Freq: Two times a day (BID) | ORAL | Status: DC

## 2012-09-23 NOTE — Progress Notes (Signed)
CSW received notification in progression meeting that pt daughter arrived early this morning and was adamant about taking pt back to [facility] first thing this morning prior to CSW being able to send pt discharge information to [facility] for review.   RN reports that pt FL2 and d/c information was provided to pt daughter to provide to facility.  CSW contacted [facility] and spoke with resident care coordinator who stated that MD had signed discharge information, so facility did not need any further information.  No further social work needs identified.  CSW signing off.    Jacklynn Lewis, MSW, LCSWA (coverage [phone]) Clinical Social Work

## 2012-09-23 NOTE — Discharge Summary (Signed)
Physician Discharge Summary  NAME:Kaylee Braun  ZOX:[id number]  DOB: 10/18/1921   Admit date: 09/20/2012 Discharge date: 09/23/2012  Discharge Diagnoses:  Active Problems:   UTI (lower urinary tract infection) - Enterobacter cloacae - pansensitive - D/C on ceftin 250 mg bid for 7 days   Volume depletion - resolved   Altered mental status -  improved   Dementia due to general medical condition without behavioral disturbance   Lung mass - Right lower lobe - 4.5 x 4.1 cm - will not further w/u secondary to age and overall status   Discharge Physical Exam:  General Appearance: Alert, cooperative, no distress, appears stated age  Weight change:  No intake or output data in the 24 hours ending 09/23/12 0759 Filed Vitals:   09/22/12 1115 09/22/12 1400 09/22/12 2000 09/23/12 0500  BP: 120/58 144/65 131/53 157/75  Pulse:  88 81 63  Temp:  96.5 F (35.8 C) 97.3 F (36.3 C) 97.7 F (36.5 C)  TempSrc:  Oral Oral Oral  Resp:  18 18   Height:      Weight:      SpO2:  95% 91% 96%    Lungs: Clear to auscultation bilaterally, respirations unlabored Heart: Regular rate and rhythm, S1 and S2 normal, no murmur, rub or gallop Abdomen: Soft, non-tender, bowel sounds active all four quadrants, no masses, no organomegaly Extremities: Extremities normal, atraumatic, no cyanosis or edema Neuro: Oriented x 1, moves all extremities grossly, mild old left facial droop  Discharge Condition: Improved  Hospital Course: Kaylee Braun is a pleasant 79 y.o. female ALF resident with history of CVA, dementia brought in by EMS with altered mental status/lethargy on 09/20/2012. Her daughter was at the bedside assisting with the history on admit and stated the night before admission when she saw her she was not acting like her usual self-and she notified staff at that point and her vitals were checked and her blood pressure was fine. On the morning of admission she states she was called by staff was noted that  the patient needed to be brought to the ED. Per EMS ALF staff found the patient to be hypotensive-initially with systolic in the 70s, and was lethargic. She was bolused with IV fluids and on recheck her blood pressure improved to 128/60. Upon arrival in the ED blood pressures remained stable, urinalysis was consistent with a UTI which ultimately grew out pansensitive enterobacter cloacae. CT scan of head showed no acute findings, abut a  chest x-ray showed an inferior right hemithorax mass-like opacity 5 x 4.1 cm highly suspicious for pulmonary neoplasm. CT scanning confirmed with a 4.5 x 4.1 cm mass. She never smoked but was exposed to secondhand smoke from her husband formany years. She was admitted, rehydrated and treated with IV Rocephin and switched to po ceftin.   On the morning of D/C, she is comfortable, conversive, and eating on her own. Discussed pulmonary mass witrh daughter Elita Quick - who agrees with no further w/u of RLL pulm mass  Things to follow up in the outpatient setting: Repeat u/a and culture in 2 weeks  Consults:  N/A  Disposition: D/C to Assisted Living - [facility]  Discharge Orders   Future Orders Complete By Expires     Call MD for:  difficulty breathing, headache or visual disturbances  As directed     Call MD for:  persistant nausea and vomiting  As directed     Call MD for:  severe uncontrolled pain  As directed     Call MD for:  temperature >100.4  As directed     Diet - low sodium heart healthy  As directed     Increase activity slowly  As directed         Medication List         acetaminophen 325 MG tablet  Commonly known as:  TYLENOL  Take 650 mg by mouth every 4 (four) hours as needed. For pain     CALTRATE 600+D 600-400 MG-UNIT per tablet  Generic drug:  Calcium Carbonate-Vitamin D  Take 1 tablet by mouth 2 (two) times daily.     cefUROXime 250 MG tablet  Commonly known as:  CEFTIN  Take 1 tablet (250 mg total) by mouth 2 (two) times daily with  a meal.     diltiazem 120 MG 24 hr capsule  Commonly known as:  DILACOR XR  Take 120 mg by mouth daily.     DULoxetine 30 MG capsule  Commonly known as:  CYMBALTA  Take 30 mg by mouth daily.     hydrocortisone cream 1 %  Apply 1 application topically 2 (two) times daily as needed. For rash     MINTOX 200-200-20 MG/5ML suspension  Generic drug:  alum & mag hydroxide-simeth  Take 20 mLs by mouth as needed. For heartburn -  not to exceed 3 doses in 24 hours     MUSCLE RUB 10-15 % Crea  Apply 1 application topically as needed. For legs and back pain     sodium chloride 0.65 % nasal spray  Commonly known as:  OCEAN  Place 2 sprays into both nostrils 3 (three) times daily as needed. For nasal congestion     triamcinolone cream 0.1 %  Commonly known as:  KENALOG  Apply 1 application topically 2 (two) times daily as needed. For rash on neck     valsartan-hydrochlorothiazide 160-12.5 MG per tablet  Commonly known as:  DIOVAN-HCT  Take 0.5 tablets by mouth daily.         The results of significant diagnostics from this hospitalization (including imaging, microbiology, ancillary and laboratory) are listed below for reference.    Significant Diagnostic Studies: Ct Head Wo Contrast  09/20/2012   *RADIOLOGY REPORT*  Clinical Data: Brought to the the from a skilled nursing facility due to old unresponsiveness, history of CVA with residual left- sided deficits.  CT HEAD WITHOUT CONTRAST  Technique:  Contiguous axial images were obtained from the base of the skull through the vertex without contrast.  Comparison: Prior CT from 03/20/2012  Findings: The is prominence of the CSF containing spaces was consistent with generalized atrophy, unchanged.  Confluent and scattered hypodensity within the periventricular deep [place] matter is consistent with chronic small vessel ischemic changes, also stable.  No acute intracranial hemorrhage or infarct is identified. There is no midline shift or mass  lesion.  No extra-axial fluid collection.  Calvarium is intact.  Visualized sinuses are clear.  IMPRESSION: Stable appearance the brain with extensive atrophy and chronic small vessel ischemic changes.  No acute intracranial process.   Original Report Authenticated By: Rise Mu, M.D.   Ct Chest Wo Contrast  09/22/2012   *RADIOLOGY REPORT*  Clinical Data: Lung mass on radiographs.  Cough.  CT CHEST WITHOUT CONTRAST  Technique:  Multidetector CT imaging of the chest was performed following the standard protocol without IV contrast.  Comparison: Radiographs 11/20/2005 and 09/20/2012.  CT 05/29/2005.  Findings: Study is moderately motion degraded.  As demonstrated radiographically, there is a dominant lobulated right lower lobe mass which measures approximately 4.5 x 4.1 cm.  This demonstrates central necrosis.  There are no other highly suspicious nodules. There is a 4 mm right upper lobe nodule on image 21 which appears stable.  There is atelectasis at both lung bases.  There are small bilateral pleural effusions.  There is no pericardial effusion.  Diffuse atherosclerosis of the aorta, great vessels and coronary arteries is noted.  Compared with the prior study, mediastinal lymph nodes have mildly enlarged, including a precarinal node measuring 1.4 cm on image 19 and an approximately 1.5 cm subcarinal node on image 25.  Hilar assessment is limited without contrast.  The visualized upper abdomen has a stable appearance.  There is a small hiatal hernia.  No adrenal mass is demonstrated.  There are no worrisome osseous findings.  IMPRESSION:  1.  CT confirms the presence of a lobulated right lower lobe mass morphologically concerning for lung cancer. Tissue sampling recommended.  PET CT may be helpful for further staging. 2.  Possible slight enlargement of precarinal and subcarinal lymph nodes.  Nodal metastases cannot be excluded. 3.  Small bilateral pleural effusions with bibasilar atelectasis.    Original Report Authenticated By: Carey Bullocks, M.D.   Dg Chest Portable 1 View  09/20/2012   *RADIOLOGY REPORT*  Clinical Data: Hypotension, fatigue  PORTABLE CHEST - 1 VIEW  Comparison: Portable exam 1016 hours compared to 11/20/2005  Findings: Upper normal heart size. Atherosclerotic calcification aorta. Mediastinal contours and pulmonary vascularity normal. Changes of COPD with bibasilar atelectasis. Mass identified inferior right chest, 5.0 x 4.1 cm highly suspicious for neoplasm. No pleural effusion or pneumothorax. Skin folds project over left chest. Bones diffusely demineralized with bilateral glenohumeral degenerative changes and minimal degenerative changes thoracic spine.  IMPRESSION: COPD with bibasilar atelectasis. Inferior right hemithorax mass-like opacity 5.0 x 4.1 cm highly suspicious for pulmonary neoplasm. CT chest with contrast recommended for further evaluation.   Original Report Authenticated By: Ulyses Southward, M.D.    Microbiology: Recent Results (from the past 240 hour(s))  URINE CULTURE     Status: None   Collection Time    09/20/12 11:22 AM      Result Value Range Status   Specimen Description URINE, CATHETERIZED   Final   Special Requests NONE   Final   Culture  Setup Time 09/20/2012 12:19   Final   Colony Count 40,000 COLONIES/ML   Final   Culture ENTEROBACTER CLOACAE   Final   Report Status 09/22/2012 FINAL   Final   Organism ID, Bacteria ENTEROBACTER CLOACAE   Final     Labs: Results for orders placed during the hospital encounter of 09/20/12  URINE CULTURE      Result Value Range   Specimen Description URINE, CATHETERIZED     Special Requests NONE     Culture  Setup Time 09/20/2012 12:19     Colony Count 40,000 COLONIES/ML     Culture ENTEROBACTER CLOACAE     Report Status 09/22/2012 FINAL     Organism ID, Bacteria ENTEROBACTER CLOACAE    CBC      Result Value Range   WBC 6.9  4.0 - 10.5 K/uL   RBC 4.18  3.87 - 5.11 MIL/uL   Hemoglobin 12.3  12.0 -  15.0 g/dL   HCT 16.1  09.6 - 04.5 %   MCV 88.5  78.0 - 100.0 fL   MCH 29.4  26.0 - 34.0 pg  MCHC 33.2  30.0 - 36.0 g/dL   RDW 16.1  09.6 - 04.5 %   Platelets 148 (*) 150 - 400 K/uL  DIFFERENTIAL      Result Value Range   Neutrophils Relative % 78 (*) 43 - 77 %   Neutro Abs 5.3  1.7 - 7.7 K/uL   Lymphocytes Relative 13  12 - 46 %   Lymphs Abs 0.9  0.7 - 4.0 K/uL   Monocytes Relative 10  3 - 12 %   Monocytes Absolute 0.7  0.1 - 1.0 K/uL   Eosinophils Relative 0  0 - 5 %   Eosinophils Absolute 0.0  0.0 - 0.7 K/uL   Basophils Relative 0  0 - 1 %   Basophils Absolute 0.0  0.0 - 0.1 K/uL  COMPREHENSIVE METABOLIC PANEL      Result Value Range   Sodium 139  135 - 145 mEq/L   Potassium 4.3  3.5 - 5.1 mEq/L   Chloride 105  96 - 112 mEq/L   CO2 26  19 - 32 mEq/L   Glucose, Bld 103 (*) 70 - 99 mg/dL   BUN 29 (*) 6 - 23 mg/dL   Creatinine, Ser 4.09 (*) 0.50 - 1.10 mg/dL   Calcium 9.6  8.4 - 81.1 mg/dL   Total Protein 6.5  6.0 - 8.3 g/dL   Albumin 3.2 (*) 3.5 - 5.2 g/dL   AST 12  0 - 37 U/L   ALT 8  0 - 35 U/L   Alkaline Phosphatase 56  39 - 117 U/L   Total Bilirubin 0.2 (*) 0.3 - 1.2 mg/dL   GFR calc non Af Amer 36 (*) >90 mL/min   GFR calc Af Amer 42 (*) >90 mL/min  TROPONIN I      Result Value Range   Troponin I <0.30  <0.30 ng/mL  URINALYSIS, ROUTINE W REFLEX MICROSCOPIC      Result Value Range   Color, Urine YELLOW  YELLOW   APPearance CLOUDY (*) CLEAR   Specific Gravity, Urine 1.020  1.005 - 1.030   pH 5.0  5.0 - 8.0   Glucose, UA NEGATIVE  NEGATIVE mg/dL   Hgb urine dipstick NEGATIVE  NEGATIVE   Bilirubin Urine NEGATIVE  NEGATIVE   Ketones, ur NEGATIVE  NEGATIVE mg/dL   Protein, ur NEGATIVE  NEGATIVE mg/dL   Urobilinogen, UA 0.2  0.0 - 1.0 mg/dL   Nitrite POSITIVE (*) NEGATIVE   Leukocytes, UA MODERATE (*) NEGATIVE  URINE RAPID DRUG SCREEN (HOSP PERFORMED)      Result Value Range   Opiates NONE DETECTED  NONE DETECTED   Cocaine NONE DETECTED  NONE DETECTED    Benzodiazepines NONE DETECTED  NONE DETECTED   Amphetamines NONE DETECTED  NONE DETECTED   Tetrahydrocannabinol NONE DETECTED  NONE DETECTED   Barbiturates NONE DETECTED  NONE DETECTED  GLUCOSE, CAPILLARY      Result Value Range   Glucose-Capillary 101 (*) 70 - 99 mg/dL  URINE MICROSCOPIC-ADD ON      Result Value Range   Squamous Epithelial / LPF FEW (*) RARE   WBC, UA 11-20  <3 WBC/hpf   Bacteria, UA MANY (*) RARE   Urine-Other LESS THAN 10 mL OF URINE SUBMITTED    CBC      Result Value Range   WBC 9.2  4.0 - 10.5 K/uL   RBC 4.47  3.87 - 5.11 MIL/uL   Hemoglobin 13.1  12.0 - 15.0 g/dL   HCT 39.5  36.0 - 46.0 %   MCV 88.4  78.0 - 100.0 fL   MCH 29.3  26.0 - 34.0 pg   MCHC 33.2  30.0 - 36.0 g/dL   RDW 16.1  09.6 - 04.5 %   Platelets 122 (*) 150 - 400 K/uL  CREATININE, SERUM      Result Value Range   Creatinine, Ser 1.01  0.50 - 1.10 mg/dL   GFR calc non Af Amer 48 (*) >90 mL/min   GFR calc Af Amer 55 (*) >90 mL/min  TSH      Result Value Range   TSH 0.781  0.350 - 4.500 uIU/mL  TROPONIN I      Result Value Range   Troponin I <0.30  <0.30 ng/mL  TROPONIN I      Result Value Range   Troponin I <0.30  <0.30 ng/mL  TROPONIN I      Result Value Range   Troponin I <0.30  <0.30 ng/mL  CBC      Result Value Range   WBC 8.0  4.0 - 10.5 K/uL   RBC 4.07  3.87 - 5.11 MIL/uL   Hemoglobin 12.0  12.0 - 15.0 g/dL   HCT 40.9 (*) 81.1 - 91.4 %   MCV 88.2  78.0 - 100.0 fL   MCH 29.5  26.0 - 34.0 pg   MCHC 33.4  30.0 - 36.0 g/dL   RDW 78.2  95.6 - 21.3 %   Platelets 136 (*) 150 - 400 K/uL  BASIC METABOLIC PANEL      Result Value Range   Sodium 142  135 - 145 mEq/L   Potassium 3.7  3.5 - 5.1 mEq/L   Chloride 109  96 - 112 mEq/L   CO2 26  19 - 32 mEq/L   Glucose, Bld 84  70 - 99 mg/dL   BUN 21  6 - 23 mg/dL   Creatinine, Ser 0.86  0.50 - 1.10 mg/dL   Calcium 9.1  8.4 - 57.8 mg/dL   GFR calc non Af Amer 47 (*) >90 mL/min   GFR calc Af Amer 55 (*) >90 mL/min  CG4 I-STAT (LACTIC  ACID)      Result Value Range   Lactic Acid, Venous 1.22  0.5 - 2.2 mmol/L    Time coordinating discharge: 45 minutes  Signed: Pearla Dubonnet, MD 09/23/2012, 7:59 AM

## 2012-10-30 ENCOUNTER — Emergency Department (HOSPITAL_COMMUNITY)
Admission: EM | Admit: 2012-10-30 | Discharge: 2012-10-30 | Disposition: A | Discharge status: Home / Self Care | Payer: Medicare Other | Attending: Emergency Medicine | Admitting: Emergency Medicine

## 2012-10-30 ENCOUNTER — Encounter (HOSPITAL_COMMUNITY): Payer: Self-pay | Admitting: Emergency Medicine

## 2012-10-30 ENCOUNTER — Emergency Department (HOSPITAL_COMMUNITY): Payer: Medicare Other

## 2012-10-30 DIAGNOSIS — Y939 Activity, unspecified: Secondary | ICD-10-CM | POA: Insufficient documentation

## 2012-10-30 DIAGNOSIS — Z862 Personal history of diseases of the blood and blood-forming organs and certain disorders involving the immune mechanism: Secondary | ICD-10-CM | POA: Insufficient documentation

## 2012-10-30 DIAGNOSIS — S0081XA Abrasion of other part of head, initial encounter: Secondary | ICD-10-CM

## 2012-10-30 DIAGNOSIS — Z79899 Other long term (current) drug therapy: Secondary | ICD-10-CM | POA: Insufficient documentation

## 2012-10-30 DIAGNOSIS — Z23 Encounter for immunization: Secondary | ICD-10-CM | POA: Insufficient documentation

## 2012-10-30 DIAGNOSIS — IMO0002 Reserved for concepts with insufficient information to code with codable children: Secondary | ICD-10-CM | POA: Insufficient documentation

## 2012-10-30 DIAGNOSIS — S0031XA Abrasion of nose, initial encounter: Secondary | ICD-10-CM

## 2012-10-30 DIAGNOSIS — Z8719 Personal history of other diseases of the digestive system: Secondary | ICD-10-CM | POA: Insufficient documentation

## 2012-10-30 DIAGNOSIS — Y9289 Other specified places as the place of occurrence of the external cause: Secondary | ICD-10-CM | POA: Insufficient documentation

## 2012-10-30 DIAGNOSIS — Z8673 Personal history of transient ischemic attack (TIA), and cerebral infarction without residual deficits: Secondary | ICD-10-CM | POA: Insufficient documentation

## 2012-10-30 DIAGNOSIS — F0390 Unspecified dementia without behavioral disturbance: Secondary | ICD-10-CM | POA: Insufficient documentation

## 2012-10-30 DIAGNOSIS — R296 Repeated falls: Secondary | ICD-10-CM | POA: Insufficient documentation

## 2012-10-30 DIAGNOSIS — Z8639 Personal history of other endocrine, nutritional and metabolic disease: Secondary | ICD-10-CM | POA: Insufficient documentation

## 2012-10-30 DIAGNOSIS — W19XXXA Unspecified fall, initial encounter: Secondary | ICD-10-CM

## 2012-10-30 DIAGNOSIS — Z8739 Personal history of other diseases of the musculoskeletal system and connective tissue: Secondary | ICD-10-CM | POA: Insufficient documentation

## 2012-10-30 MED ORDER — TETANUS-DIPHTHERIA TOXOIDS TD 5-2 LFU IM INJ
0.5000 mL | INJECTION | Freq: Once | INTRAMUSCULAR | Status: AC
  Administered 2012-10-30: 0.5 mL via INTRAMUSCULAR
  Filled 2012-10-30: qty 0.5

## 2012-10-30 NOTE — ED Notes (Signed)
Dr. Clarene Duke at bedside for evaluation.

## 2012-10-30 NOTE — ED Notes (Signed)
C- COLLAR APPLIED AT TRIAGE.  

## 2012-10-30 NOTE — ED Notes (Addendum)
PT. FOUND ON THE FLOOR BY STAFF AT [facility] , PRESENTS WITH HEMATOMA AT RIGHT FOREHEAD/ABRASION AND SMALL ABRASION  AT UPPER BRIDGE OF NOSE WITH MINIMAL BLEEDING. NO LOC , ALERT AND CONSCIOUS AT ARRIVAL . CBG= 84 BY EMS.  Marland Kitchen

## 2012-10-30 NOTE — ED Notes (Signed)
Dr. Clarene Duke in with patient and daughter.

## 2012-10-30 NOTE — ED Notes (Signed)
NURSE FIRST ROUNDS : PT. RESTING ON STRETCHER WITH PAIN OR DISCOMFORT , RESPIRATIONS UNLABORED , DENIES PAIN AT THIS TIME , BEDRAILS UP , PT. WAITING FOR CT SCAN .

## 2012-10-30 NOTE — ED Notes (Signed)
Family at bedside. Pt in triage waiting on stretcher, resting comfortably.

## 2012-10-30 NOTE — ED Provider Notes (Signed)
CSN: [id number]     Arrival date & time 10/30/12  0033 History     First MD Initiated Contact with Patient 10/30/12 0350     Chief Complaint  Patient presents with  . Fall    Patient is a 79 y.o. female presenting with fall. The history is provided by a relative, the EMS personnel and the nursing home. The history is limited by the condition of the patient (Hx dementia).  Fall  Pt was seen at 0405. Per EMS, NH report, and family, c/o pt found on the floor by staff at [facility]. Pt apparently rolled out of bed; has hx of same per daughter at bedside. Pt has significant hx of dementia and has been acting per her baseline per NH staff and family at bedside. Pt herself denies any complaints.    Past Medical History  Diagnosis Date  . Stroke   . Diverticulitis   . Hyperparathyroidism   . DJD (degenerative joint disease)   . Dementia    History reviewed. No pertinent past surgical history.  History  Substance Use Topics  . Smoking status: Never Smoker   . Smokeless tobacco: Not on file  . Alcohol Use: No    Review of Systems  Unable to perform ROS: Dementia    Allergies  Coconut oil and Oysters  Home Medications   Current Outpatient Rx  Name  Route  Sig  Dispense  Refill  . acetaminophen (TYLENOL) 325 MG tablet   Oral   Take 650 mg by mouth every 4 (four) hours as needed. For pain         . alum & mag hydroxide-simeth (MINTOX) 200-200-20 MG/5ML suspension   Oral   Take 20 mLs by mouth as needed. For heartburn -  not to exceed 3 doses in 24 hours         . Calcium Carbonate-Vitamin D (CALTRATE 600+D) 600-400 MG-UNIT per tablet   Oral   Take 1 tablet by mouth 2 (two) times daily.         Marland Kitchen diltiazem (DILACOR XR) 120 MG 24 hr capsule   Oral   Take 120 mg by mouth daily.         . DULoxetine (CYMBALTA) 30 MG capsule   Oral   Take 30 mg by mouth daily.         Marland Kitchen guaiFENesin-dextromethorphan (ROBITUSSIN DM) 100-10 MG/5ML syrup   Oral   Take 5  mLs by mouth 3 (three) times daily as needed for cough.         . hydrocortisone cream 1 %   Topical   Apply 1 application topically 2 (two) times daily as needed. For rash         . Menthol-Methyl Salicylate (MUSCLE RUB) 10-15 % CREA   Topical   Apply 1 application topically as needed. For legs and back pain         . polyethylene glycol (MIRALAX / GLYCOLAX) packet   Oral   Take 17 g by mouth daily.         . sodium chloride (OCEAN) 0.65 % nasal spray   Each Nare   Place 2 sprays into both nostrils 3 (three) times daily as needed. For nasal congestion         . triamcinolone cream (KENALOG) 0.1 %   Topical   Apply 1 application topically 2 (two) times daily as needed. For rash on neck         . valsartan-hydrochlorothiazide (  DIOVAN-HCT) 160-12.5 MG per tablet   Oral   Take 0.5 tablets by mouth daily.          BP 121/67  Pulse 96  Temp(Src) 98.3 F (36.8 C) (Oral)  Resp 18  SpO2 96% Physical Exam 0410: Physical examination: Vital signs and O2 SAT: Reviewed; Constitutional: Well developed, Well nourished, Well hydrated, In no acute distress; Head and Face: Normocephalic, +very superficial abrasion to right forehead and bridge of nose.; Eyes: EOMI, PERRL, No scleral icterus; ENMT: Mouth and pharynx normal, Left TM normal, Right TM normal, Mucous membranes moist; Neck: Immobilized in C-collar, Trachea midline; Spine: No midline CS, TS, LS tenderness.; Cardiovascular: Regular rate and rhythm, No gallop; Respiratory: Breath sounds clear & equal bilaterally, No wheezes, Normal respiratory effort/excursion; Chest: Nontender, No deformity, Movement normal, No crepitus, No abrasions or ecchymosis.; Abdomen: Soft, Nontender, Nondistended, Normal bowel sounds, No abrasions or ecchymosis.;; Extremities: No deformity, Full range of motion major/large joints of bilat UE's and LE's without pain or tenderness to palp, Neurovascularly intact, Pulses normal, No tenderness, No edema,  Pelvis stable; Neuro: Awake, alert, confused re: time, place, events per hx dementia. Major CN grossly intact. Speech clear. +left sided weakness per hx previous CVA..; Skin: Color normal, Warm, Dry    ED Course   Procedures    MDM  MDM Reviewed: previous chart, nursing note and vitals Interpretation: CT scan   Ct Head Wo Contrast 10/30/2012   *RADIOLOGY REPORT*  Clinical Data:  Patient found down.  Hematoma right forehead.  CT HEAD WITHOUT CONTRAST CT CERVICAL SPINE WITHOUT CONTRAST  Technique:  Multidetector CT imaging of the head and cervical spine was performed following the standard protocol without intravenous contrast.  Multiplanar CT image reconstructions of the cervical spine were also generated.  Comparison:  Head CT scan 09/20/2012.  CT HEAD  Findings: Atrophy and extensive chronic microvascular ischemic change are again seen.  No evidence of acute intracranial abnormality including hemorrhage, infarction, mass lesion, mass effect, midline shift or abnormal extra-axial fluid collection is identified.  There is no pneumocephalus or hydrocephalus.  There appears to be a hematoma over the right frontal bone.  No underlying fracture.  IMPRESSION:  1.  Negative for fracture.  No acute intracranial abnormality. 2.  Atrophy and extensive chronic microvascular ischemic change.  CT CERVICAL SPINE  Findings: Vertebral body height and alignment are normal.  There is multilevel degenerative change appearing worst at C6-7 and C7-T1. The lung apices are clear.  IMPRESSION: No acute finding.   Original Report Authenticated By: Holley Dexter, M.D.   Ct Cervical Spine Wo Contrast 10/30/2012   *RADIOLOGY REPORT*  Clinical Data:  Patient found down.  Hematoma right forehead.  CT HEAD WITHOUT CONTRAST CT CERVICAL SPINE WITHOUT CONTRAST  Technique:  Multidetector CT imaging of the head and cervical spine was performed following the standard protocol without intravenous contrast.  Multiplanar CT image  reconstructions of the cervical spine were also generated.  Comparison:  Head CT scan 09/20/2012.  CT HEAD  Findings: Atrophy and extensive chronic microvascular ischemic change are again seen.  No evidence of acute intracranial abnormality including hemorrhage, infarction, mass lesion, mass effect, midline shift or abnormal extra-axial fluid collection is identified.  There is no pneumocephalus or hydrocephalus.  There appears to be a hematoma over the right frontal bone.  No underlying fracture.  IMPRESSION:  1.  Negative for fracture.  No acute intracranial abnormality. 2.  Atrophy and extensive chronic microvascular ischemic change.  CT CERVICAL SPINE  Findings:  Vertebral body height and alignment are normal.  There is multilevel degenerative change appearing worst at C6-7 and C7-T1. The lung apices are clear.  IMPRESSION: No acute finding.   Original Report Authenticated By: Holley Dexter, M.D.     [phone]:  Td updated. Wound care to abrasions provided. No midline CS tenderness, FROM CS without midline tenderness. No NMS changes.  C-collar removed.  Pt continues to act per her baseline per family at bedside. Family would like to have her return to NH now. Dx and testing d/w pt and family.  Questions answered.  Verb understanding, agreeable to d/c back to nursing home with outpt f/u.     Laray Anger, DO 11/01/12 [phone]

## 2013-04-11 ENCOUNTER — Emergency Department (HOSPITAL_COMMUNITY)
Admission: EM | Admit: 2013-04-11 | Discharge: 2013-04-12 | Disposition: A | Discharge status: Skilled Nursing Facility | Payer: Medicare Other | Attending: Emergency Medicine | Admitting: Emergency Medicine

## 2013-04-11 ENCOUNTER — Encounter (HOSPITAL_COMMUNITY): Payer: Self-pay | Admitting: Emergency Medicine

## 2013-04-11 DIAGNOSIS — Z8673 Personal history of transient ischemic attack (TIA), and cerebral infarction without residual deficits: Secondary | ICD-10-CM | POA: Insufficient documentation

## 2013-04-11 DIAGNOSIS — N390 Urinary tract infection, site not specified: Secondary | ICD-10-CM

## 2013-04-11 DIAGNOSIS — F0390 Unspecified dementia without behavioral disturbance: Secondary | ICD-10-CM | POA: Insufficient documentation

## 2013-04-11 DIAGNOSIS — Z8719 Personal history of other diseases of the digestive system: Secondary | ICD-10-CM | POA: Insufficient documentation

## 2013-04-11 DIAGNOSIS — Z79899 Other long term (current) drug therapy: Secondary | ICD-10-CM | POA: Insufficient documentation

## 2013-04-11 DIAGNOSIS — Z792 Long term (current) use of antibiotics: Secondary | ICD-10-CM | POA: Insufficient documentation

## 2013-04-11 DIAGNOSIS — Z8739 Personal history of other diseases of the musculoskeletal system and connective tissue: Secondary | ICD-10-CM | POA: Insufficient documentation

## 2013-04-11 DIAGNOSIS — Z862 Personal history of diseases of the blood and blood-forming organs and certain disorders involving the immune mechanism: Secondary | ICD-10-CM | POA: Insufficient documentation

## 2013-04-11 DIAGNOSIS — Z8639 Personal history of other endocrine, nutritional and metabolic disease: Secondary | ICD-10-CM | POA: Insufficient documentation

## 2013-04-11 NOTE — ED Notes (Signed)
Pt arrived to the ED with a complaint of altered mental status.  [facility] via her daugther.  Pt's has a history of UTI.  Pt's daughter has noticed lethargy and confusion presently.  Pt also has been complaining of pain in her bottom.  Pt has a hx of large bowel movements and has not had a bowel movement today.

## 2013-04-11 NOTE — ED Provider Notes (Signed)
CSN: [id number]     Arrival date & time 04/11/13  1906 History   First MD Initiated Contact with Patient 04/11/13 2026     Chief Complaint  Patient presents with  . Altered Mental Status   (Consider location/radiation/quality/duration/timing/severity/associated sxs/prior Treatment) HPI Comments: Pt resides in assisted living.  Pt has had numerous UTI's in the past and usually presents with confusion.  Pt has been displaying similar symptoms as in the past for the past 2 days per daughter.  Pt's facility was not able to obtain a urine sample and was not listening to daughter's concerns effectively, thus she decided to bring pt to the ED.  No N/V although she may have regurgitated her meds yesterday, none since then.  No known fevers, coughing, diarrhea, CP, abd pain.  Pt reports she feels "ok" to me.    Patient is a 80 y.o. female presenting with altered mental status. The history is provided by the patient and a relative.  Altered Mental Status   Past Medical History  Diagnosis Date  . Stroke   . Diverticulitis   . Hyperparathyroidism   . DJD (degenerative joint disease)   . Dementia    History reviewed. No pertinent past surgical history. History reviewed. No pertinent family history. History  Substance Use Topics  . Smoking status: Never Smoker   . Smokeless tobacco: Not on file  . Alcohol Use: No   OB History   Grav Para Term Preterm Abortions TAB SAB Ect Mult Living                 Review of Systems  Unable to perform ROS: Dementia    Allergies  Coconut oil and Oysters  Home Medications   Current Outpatient Rx  Name  Route  Sig  Dispense  Refill  . acetaminophen (TYLENOL) 325 MG tablet   Oral   Take 650 mg by mouth every 4 (four) hours as needed. For pain         . alum & mag hydroxide-simeth (MINTOX) 200-200-20 MG/5ML suspension   Oral   Take 20 mLs by mouth as needed. For heartburn -  not to exceed 3 doses in 24 hours         . Calcium Carbonate-Vitamin  D (CALTRATE 600+D) 600-400 MG-UNIT per tablet   Oral   Take 1 tablet by mouth 2 (two) times daily.         Marland Kitchen. diltiazem (DILACOR XR) 120 MG 24 hr capsule   Oral   Take 120 mg by mouth daily.         . DULoxetine (CYMBALTA) 30 MG capsule   Oral   Take 30 mg by mouth daily.         Marland Kitchen. guaiFENesin-dextromethorphan (ROBITUSSIN DM) 100-10 MG/5ML syrup   Oral   Take 5 mLs by mouth 3 (three) times daily as needed for cough.         . hydrocortisone cream 1 %   Topical   Apply 1 application topically 2 (two) times daily as needed. For rash         . loratadine (CLARITIN) 10 MG tablet   Oral   Take 10 mg by mouth daily as needed (sneezing).         . Menthol-Methyl Salicylate (MUSCLE RUB) 10-15 % CREA   Topical   Apply 1 application topically as needed. For legs and back pain         . polyethylene glycol (MIRALAX / GLYCOLAX) packet  Oral   Take 17 g by mouth daily.         . sodium chloride (OCEAN) 0.65 % nasal spray   Each Nare   Place 2 sprays into both nostrils 3 (three) times daily as needed. For nasal congestion         . triamcinolone cream (KENALOG) 0.1 %   Topical   Apply 1 application topically 2 (two) times daily as needed. For rash on neck         . valsartan-hydrochlorothiazide (DIOVAN-HCT) 160-12.5 MG per tablet   Oral   Take 0.5 tablets by mouth daily.         . cephALEXin (KEFLEX) 500 MG capsule   Oral   Take 1 capsule (500 mg total) by mouth 3 (three) times daily.   30 capsule   0    BP 151/78  Pulse 91  Temp(Src) 97.3 F (36.3 C) (Oral)  Resp 18  SpO2 98% Physical Exam  Nursing note and vitals reviewed. Constitutional: She appears well-developed and well-nourished.  Non-toxic appearance. She does not appear ill. No distress.  HENT:  Head: Normocephalic and atraumatic.  Eyes: Conjunctivae are normal. No scleral icterus.  Neck: Normal range of motion. Neck supple.  Cardiovascular: Normal rate, regular rhythm and intact distal  pulses.   Pulmonary/Chest: Effort normal. No respiratory distress.  Abdominal: Soft. She exhibits no distension. There is no rebound and no guarding.  Neurological: She is alert. Coordination normal.  Skin: Skin is warm and dry. No pallor.  Psychiatric: Her mood appears not anxious. Her affect is not angry. Her speech is not rapid and/or pressured. She is slowed.    ED Course  Procedures (including critical care time) Labs Review Labs Reviewed  URINE CULTURE  URINALYSIS, ROUTINE W REFLEX MICROSCOPIC   Imaging Review No results found.  EKG Interpretation   None      RA sat is 96% and I interpret to be normal  UA obtained.  Not septic appearing, stable BP's throughout visit.  Pt's daughter is ok with pt going home, appreciates starting on empiric abx to cover for UTI.  UA and culture obtained, are pending.  Will go ahead and treat for now and can follow up with PCP on Monday.     MDM   1. Urinary tract infection      Attempted to do an in and out, but was not able to as pt has twice urinated on the bed.  I reviewed pt's prior notes and in June 2014, pt was near septic and required IV abx in the hospital.  Given her history, would like at least to obtain a sample to send for urine culture and to start oral abx therapy.  Not hypotensive here, not septic appearing.      Gavin Pound. Lekeshia Kram, MD 04/12/13 6440

## 2013-04-11 NOTE — ED Notes (Signed)
3 attempts made to place catheter for urine collection without success. Patient placed on bedpan. RN will notify MD.

## 2013-04-12 LAB — URINALYSIS, ROUTINE W REFLEX MICROSCOPIC
Bilirubin Urine: NEGATIVE
Glucose, UA: NEGATIVE mg/dL
Ketones, ur: NEGATIVE mg/dL
Nitrite: NEGATIVE
Protein, ur: NEGATIVE mg/dL
Specific Gravity, Urine: 1.02 (ref 1.005–1.030)
Urobilinogen, UA: 0.2 mg/dL (ref 0.0–1.0)
pH: 5.5 (ref 5.0–8.0)

## 2013-04-12 LAB — URINE MICROSCOPIC-ADD ON

## 2013-04-12 MED ORDER — CIPROFLOXACIN HCL 500 MG PO TABS
500.0000 mg | ORAL_TABLET | Freq: Once | ORAL | Status: AC
  Administered 2013-04-12: 500 mg via ORAL
  Filled 2013-04-12: qty 1

## 2013-04-12 MED ORDER — CEPHALEXIN 500 MG PO CAPS
500.0000 mg | ORAL_CAPSULE | Freq: Once | ORAL | Status: DC
  Filled 2013-04-12: qty 1

## 2013-04-12 MED ORDER — CEPHALEXIN 500 MG PO CAPS: 500.0000 mg | ORAL_CAPSULE | Freq: Three times a day (TID) | ORAL | Status: DC

## 2013-04-12 MED ORDER — CIPROFLOXACIN HCL 250 MG PO TABS: 250.0000 mg | ORAL_TABLET | Freq: Two times a day (BID) | ORAL | Status: DC

## 2013-04-12 NOTE — Discharge Instructions (Signed)
Urinary Tract Infection  Urinary tract infections (UTIs) can develop anywhere along your urinary tract. Your urinary tract is your body's drainage system for removing wastes and extra water. Your urinary tract includes two kidneys, two ureters, a bladder, and a urethra. Your kidneys are a pair of bean-shaped organs. Each kidney is about the size of your fist. They are located below your ribs, one on each side of your spine.  CAUSES  Infections are caused by microbes, which are microscopic organisms, including fungi, viruses, and bacteria. These organisms are so small that they can only be seen through a microscope. Bacteria are the microbes that most commonly cause UTIs.  SYMPTOMS   Symptoms of UTIs may vary by age and gender of the patient and by the location of the infection. Symptoms in young women typically include a frequent and intense urge to urinate and a painful, burning feeling in the bladder or urethra during urination. Older women and men are more likely to be tired, shaky, and weak and have muscle aches and abdominal pain. A fever may mean the infection is in your kidneys. Other symptoms of a kidney infection include pain in your back or sides below the ribs, nausea, and vomiting.  DIAGNOSIS  To diagnose a UTI, your caregiver will ask you about your symptoms. Your caregiver also will ask to provide a urine sample. The urine sample will be tested for bacteria and Bacha blood cells. Aldape blood cells are made by your body to help fight infection.  TREATMENT   Typically, UTIs can be treated with medication. Because most UTIs are caused by a bacterial infection, they usually can be treated with the use of antibiotics. The choice of antibiotic and length of treatment depend on your symptoms and the type of bacteria causing your infection.  HOME CARE INSTRUCTIONS   If you were prescribed antibiotics, take them exactly as your caregiver instructs you. Finish the medication even if you feel better after you  have only taken some of the medication.   Drink enough water and fluids to keep your urine clear or pale yellow.   Avoid caffeine, tea, and carbonated beverages. They tend to irritate your bladder.   Empty your bladder often. Avoid holding urine for long periods of time.   Empty your bladder before and after sexual intercourse.   After a bowel movement, women should cleanse from front to back. Use each tissue only once.  SEEK MEDICAL CARE IF:    You have back pain.   You develop a fever.   Your symptoms do not begin to resolve within 3 days.  SEEK IMMEDIATE MEDICAL CARE IF:    You have severe back pain or lower abdominal pain.   You develop chills.   You have nausea or vomiting.   You have continued burning or discomfort with urination.  MAKE SURE YOU:    Understand these instructions.   Will watch your condition.   Will get help right away if you are not doing well or get worse.  Document Released: 12/21/2004 Document Revised: 09/12/2011 Document Reviewed: 04/21/2011  [employer] Patient Information 2014 [employer].

## 2013-04-14 LAB — URINE CULTURE
Colony Count: 35000
Special Requests: NORMAL

## 2013-05-18 ENCOUNTER — Encounter (HOSPITAL_COMMUNITY): Payer: Self-pay | Admitting: Emergency Medicine

## 2013-05-18 ENCOUNTER — Inpatient Hospital Stay (HOSPITAL_COMMUNITY): Payer: Medicare Other

## 2013-05-18 ENCOUNTER — Inpatient Hospital Stay (HOSPITAL_COMMUNITY)
Admission: EM | Admit: 2013-05-18 | Discharge: 2013-05-23 | DRG: 177 | Disposition: A | Discharge status: Nursing Home (Includes ICF) | Payer: Medicare Other | Attending: Internal Medicine | Admitting: Internal Medicine

## 2013-05-18 ENCOUNTER — Emergency Department (HOSPITAL_COMMUNITY): Payer: Medicare Other

## 2013-05-18 DIAGNOSIS — G9340 Encephalopathy, unspecified: Secondary | ICD-10-CM | POA: Diagnosis present

## 2013-05-18 DIAGNOSIS — R4182 Altered mental status, unspecified: Secondary | ICD-10-CM

## 2013-05-18 DIAGNOSIS — I10 Essential (primary) hypertension: Secondary | ICD-10-CM | POA: Diagnosis present

## 2013-05-18 DIAGNOSIS — J189 Pneumonia, unspecified organism: Secondary | ICD-10-CM

## 2013-05-18 DIAGNOSIS — F0390 Unspecified dementia without behavioral disturbance: Secondary | ICD-10-CM | POA: Diagnosis present

## 2013-05-18 DIAGNOSIS — A498 Other bacterial infections of unspecified site: Secondary | ICD-10-CM | POA: Diagnosis present

## 2013-05-18 DIAGNOSIS — N390 Urinary tract infection, site not specified: Secondary | ICD-10-CM | POA: Diagnosis present

## 2013-05-18 DIAGNOSIS — N179 Acute kidney failure, unspecified: Secondary | ICD-10-CM | POA: Diagnosis present

## 2013-05-18 DIAGNOSIS — R918 Other nonspecific abnormal finding of lung field: Secondary | ICD-10-CM | POA: Diagnosis present

## 2013-05-18 DIAGNOSIS — D6959 Other secondary thrombocytopenia: Secondary | ICD-10-CM | POA: Diagnosis present

## 2013-05-18 DIAGNOSIS — Z66 Do not resuscitate: Secondary | ICD-10-CM | POA: Diagnosis present

## 2013-05-18 DIAGNOSIS — R5383 Other fatigue: Secondary | ICD-10-CM

## 2013-05-18 DIAGNOSIS — Y95 Nosocomial condition: Secondary | ICD-10-CM

## 2013-05-18 DIAGNOSIS — D696 Thrombocytopenia, unspecified: Secondary | ICD-10-CM | POA: Diagnosis present

## 2013-05-18 DIAGNOSIS — R1310 Dysphagia, unspecified: Secondary | ICD-10-CM | POA: Diagnosis present

## 2013-05-18 DIAGNOSIS — J690 Pneumonitis due to inhalation of food and vomit: Principal | ICD-10-CM | POA: Diagnosis present

## 2013-05-18 DIAGNOSIS — R5381 Other malaise: Secondary | ICD-10-CM | POA: Diagnosis present

## 2013-05-18 DIAGNOSIS — Z8673 Personal history of transient ischemic attack (TIA), and cerebral infarction without residual deficits: Secondary | ICD-10-CM

## 2013-05-18 DIAGNOSIS — E860 Dehydration: Secondary | ICD-10-CM | POA: Diagnosis present

## 2013-05-18 DIAGNOSIS — R222 Localized swelling, mass and lump, trunk: Secondary | ICD-10-CM | POA: Diagnosis present

## 2013-05-18 LAB — CBC WITH DIFFERENTIAL/PLATELET
Basophils Absolute: 0 10*3/uL (ref 0.0–0.1)
Basophils Relative: 0 % (ref 0–1)
Eosinophils Absolute: 0 10*3/uL (ref 0.0–0.7)
Eosinophils Relative: 0 % (ref 0–5)
HCT: 37.1 % (ref 36.0–46.0)
HEMOGLOBIN: 12 g/dL (ref 12.0–15.0)
LYMPHS ABS: 0.6 10*3/uL — AB (ref 0.7–4.0)
Lymphocytes Relative: 13 % (ref 12–46)
MCH: 28.9 pg (ref 26.0–34.0)
MCHC: 32.3 g/dL (ref 30.0–36.0)
MCV: 89.4 fL (ref 78.0–100.0)
Monocytes Absolute: 1.5 10*3/uL — ABNORMAL HIGH (ref 0.1–1.0)
Monocytes Relative: 32 % — ABNORMAL HIGH (ref 3–12)
NEUTROS ABS: 2.5 10*3/uL (ref 1.7–7.7)
Neutrophils Relative %: 54 % (ref 43–77)
Platelets: 128 10*3/uL — ABNORMAL LOW (ref 150–400)
RBC: 4.15 MIL/uL (ref 3.87–5.11)
RDW: 14.4 % (ref 11.5–15.5)
WBC: 4.6 10*3/uL (ref 4.0–10.5)

## 2013-05-18 LAB — URINALYSIS, ROUTINE W REFLEX MICROSCOPIC
Bilirubin Urine: NEGATIVE
Glucose, UA: NEGATIVE mg/dL
Ketones, ur: NEGATIVE mg/dL
Nitrite: NEGATIVE
PROTEIN: NEGATIVE mg/dL
Specific Gravity, Urine: 1.016 (ref 1.005–1.030)
UROBILINOGEN UA: 0.2 mg/dL (ref 0.0–1.0)
pH: 6 (ref 5.0–8.0)

## 2013-05-18 LAB — URINE MICROSCOPIC-ADD ON

## 2013-05-18 LAB — COMPREHENSIVE METABOLIC PANEL
ALT: 13 U/L (ref 0–35)
AST: 26 U/L (ref 0–37)
Albumin: 3 g/dL — ABNORMAL LOW (ref 3.5–5.2)
Alkaline Phosphatase: 73 U/L (ref 39–117)
BILIRUBIN TOTAL: 0.2 mg/dL — AB (ref 0.3–1.2)
BUN: 31 mg/dL — AB (ref 6–23)
CHLORIDE: 100 meq/L (ref 96–112)
CO2: 23 meq/L (ref 19–32)
CREATININE: 1.33 mg/dL — AB (ref 0.50–1.10)
Calcium: 10 mg/dL (ref 8.4–10.5)
GFR calc Af Amer: 40 mL/min — ABNORMAL LOW (ref >90)
GFR, EST NON AFRICAN AMERICAN: 34 mL/min — AB (ref >90)
Glucose, Bld: 129 mg/dL — ABNORMAL HIGH (ref 70–99)
Potassium: 4.1 mEq/L (ref 3.7–5.3)
Sodium: 137 mEq/L (ref 137–147)
Total Protein: 6.8 g/dL (ref 6.0–8.3)

## 2013-05-18 LAB — CBG MONITORING, ED: GLUCOSE-CAPILLARY: 96 mg/dL (ref 70–99)

## 2013-05-18 LAB — I-STAT CG4 LACTIC ACID, ED: LACTIC ACID, VENOUS: 1.93 mmol/L (ref 0.5–2.2)

## 2013-05-18 MED ORDER — DEXTROSE 5 % IV SOLN
2.0000 g | Freq: Once | INTRAVENOUS | Status: AC
  Administered 2013-05-18: 2 g via INTRAVENOUS

## 2013-05-18 MED ORDER — ONDANSETRON HCL 4 MG/2ML IJ SOLN
4.0000 mg | Freq: Four times a day (QID) | INTRAMUSCULAR | Status: DC | PRN
  Administered 2013-05-21: 4 mg via INTRAVENOUS
  Filled 2013-05-18: qty 2

## 2013-05-18 MED ORDER — ACETAMINOPHEN 650 MG RE SUPP: 650.0000 mg | Freq: Four times a day (QID) | RECTAL | Status: DC | PRN

## 2013-05-18 MED ORDER — SODIUM CHLORIDE 0.9 % IV SOLN
1000.0000 mL | Freq: Once | INTRAVENOUS | Status: AC
  Administered 2013-05-18: 1000 mL via INTRAVENOUS

## 2013-05-18 MED ORDER — ALUM & MAG HYDROXIDE-SIMETH 200-200-20 MG/5ML PO SUSP: 20.0000 mL | ORAL | Status: DC | PRN

## 2013-05-18 MED ORDER — GUAIFENESIN-DM 100-10 MG/5ML PO SYRP
5.0000 mL | ORAL_SOLUTION | Freq: Three times a day (TID) | ORAL | Status: DC | PRN
  Administered 2013-05-21 – 2013-05-22 (×2): 5 mL via ORAL
  Filled 2013-05-18 (×2): qty 10

## 2013-05-18 MED ORDER — SODIUM CHLORIDE 0.9 % IV SOLN
INTRAVENOUS | Status: AC
  Administered 2013-05-19: 11:00:00 via INTRAVENOUS

## 2013-05-18 MED ORDER — POLYETHYLENE GLYCOL 3350 17 G PO PACK
17.0000 g | PACK | Freq: Every day | ORAL | Status: DC
  Administered 2013-05-19 – 2013-05-23 (×3): 17 g via ORAL
  Filled 2013-05-18 (×5): qty 1

## 2013-05-18 MED ORDER — ACETAMINOPHEN 325 MG PO TABS: 650.0000 mg | ORAL_TABLET | Freq: Four times a day (QID) | ORAL | Status: DC | PRN

## 2013-05-18 MED ORDER — IRBESARTAN 150 MG PO TABS
150.0000 mg | ORAL_TABLET | Freq: Every day | ORAL | Status: DC
  Administered 2013-05-19 – 2013-05-23 (×5): 150 mg via ORAL
  Filled 2013-05-18 (×5): qty 1

## 2013-05-18 MED ORDER — DULOXETINE HCL 30 MG PO CPEP
30.0000 mg | ORAL_CAPSULE | Freq: Every day | ORAL | Status: DC
  Administered 2013-05-19 – 2013-05-23 (×3): 30 mg via ORAL
  Filled 2013-05-18 (×5): qty 1

## 2013-05-18 MED ORDER — ENOXAPARIN SODIUM 40 MG/0.4ML ~~LOC~~ SOLN
40.0000 mg | SUBCUTANEOUS | Status: DC
  Administered 2013-05-19: 40 mg via SUBCUTANEOUS
  Filled 2013-05-18: qty 0.4

## 2013-05-18 MED ORDER — TRIAMCINOLONE ACETONIDE 0.1 % EX CREA
1.0000 "application " | TOPICAL_CREAM | Freq: Two times a day (BID) | CUTANEOUS | Status: DC | PRN
  Filled 2013-05-18: qty 15

## 2013-05-18 MED ORDER — DILTIAZEM HCL ER 120 MG PO CP24
120.0000 mg | ORAL_CAPSULE | Freq: Every day | ORAL | Status: DC
  Administered 2013-05-19 – 2013-05-23 (×3): 120 mg via ORAL
  Filled 2013-05-18 (×5): qty 1

## 2013-05-18 MED ORDER — SODIUM CHLORIDE 0.9 % IV SOLN
1000.0000 mL | INTRAVENOUS | Status: DC
  Administered 2013-05-18: 1000 mL via INTRAVENOUS

## 2013-05-18 MED ORDER — ONDANSETRON HCL 4 MG PO TABS: 4.0000 mg | ORAL_TABLET | Freq: Four times a day (QID) | ORAL | Status: DC | PRN

## 2013-05-18 MED ORDER — CALCIUM CARBONATE-VITAMIN D 500-200 MG-UNIT PO TABS
1.0000 | ORAL_TABLET | Freq: Two times a day (BID) | ORAL | Status: DC
  Administered 2013-05-19 – 2013-05-23 (×9): 1 via ORAL
  Filled 2013-05-18 (×10): qty 1

## 2013-05-18 MED ORDER — LORATADINE 10 MG PO TABS
10.0000 mg | ORAL_TABLET | Freq: Every day | ORAL | Status: DC | PRN
  Filled 2013-05-18: qty 1

## 2013-05-18 MED ORDER — HYDROCORTISONE 1 % EX CREA
1.0000 "application " | TOPICAL_CREAM | Freq: Two times a day (BID) | CUTANEOUS | Status: DC | PRN
  Filled 2013-05-18: qty 28

## 2013-05-18 MED ORDER — VANCOMYCIN HCL IN DEXTROSE 1-5 GM/200ML-% IV SOLN
1000.0000 mg | Freq: Once | INTRAVENOUS | Status: AC
  Administered 2013-05-18: 1000 mg via INTRAVENOUS
  Filled 2013-05-18: qty 200

## 2013-05-18 NOTE — ED Provider Notes (Signed)
CSN: [id number]     Arrival date & time 05/18/13  1826 History   First MD Initiated Contact with Patient 05/18/13 1901     Chief Complaint  Patient presents with  . Altered Mental Status     (Consider location/radiation/quality/duration/timing/severity/associated sxs/prior Treatment) HPI Comments: Patient is an 80 year old female with a past medical history of dementia and stroke brought in to the emergency department by her [facility] with concerns of altered mental status. Daughter states over the past 2 days patient has not been acting herself, has had a harsh sounding cough, weakness and altered mental status. States this is how she is active in the past with urinary tract infections. This evening when the daughter went to bring patient dinner, patient was questioning if the daughter could "see the fox" and was acting lethargic. Nursing home told daughter pt has been breaking out into sweats, however they had the heat in her room at 85 degrees. Pt unable to provide information to the history, level V caveat due to AMS.  The history is provided by a relative.    Past Medical History  Diagnosis Date  . Stroke   . Diverticulitis   . Hyperparathyroidism   . DJD (degenerative joint disease)   . Dementia    History reviewed. No pertinent past surgical history. No family history on file. History  Substance Use Topics  . Smoking status: Never Smoker   . Smokeless tobacco: Not on file  . Alcohol Use: No   OB History   Grav Para Term Preterm Abortions TAB SAB Ect Mult Living                 Review of Systems  Unable to perform ROS: Mental status change      Allergies  Coconut oil and Oysters  Home Medications   Current Outpatient Rx  Name  Route  Sig  Dispense  Refill  . acetaminophen (TYLENOL) 325 MG tablet   Oral   Take 650 mg by mouth every 4 (four) hours as needed. For pain         . alum & mag hydroxide-simeth (MINTOX) 200-200-20  MG/5ML suspension   Oral   Take 20 mLs by mouth as needed. For heartburn -  not to exceed 3 doses in 24 hours         . Calcium Carbonate-Vitamin D (CALTRATE 600+D) 600-400 MG-UNIT per tablet   Oral   Take 1 tablet by mouth 2 (two) times daily.         Marland Kitchen. diltiazem (DILACOR XR) 120 MG 24 hr capsule   Oral   Take 120 mg by mouth daily.         . DULoxetine (CYMBALTA) 30 MG capsule   Oral   Take 30 mg by mouth daily.         Marland Kitchen. guaiFENesin-dextromethorphan (ROBITUSSIN DM) 100-10 MG/5ML syrup   Oral   Take 5 mLs by mouth 3 (three) times daily as needed for cough.         . hydrocortisone cream 1 %   Topical   Apply 1 application topically 2 (two) times daily as needed. For rash         . loratadine (CLARITIN) 10 MG tablet   Oral   Take 10 mg by mouth daily as needed (sneezing).         . polyethylene glycol (MIRALAX / GLYCOLAX) packet   Oral   Take 17 g by mouth daily.         .Marland Kitchen  sodium chloride (OCEAN) 0.65 % nasal spray   Each Nare   Place 2 sprays into both nostrils 3 (three) times daily as needed. For nasal congestion         . triamcinolone cream (KENALOG) 0.1 %   Topical   Apply 1 application topically 2 (two) times daily as needed. For rash on neck         . valsartan-hydrochlorothiazide (DIOVAN-HCT) 160-12.5 MG per tablet   Oral   Take 0.5 tablets by mouth daily.          BP 128/58  Pulse 90  Temp(Src) 99.1 F (37.3 C) (Rectal)  Resp 20  SpO2 90% Physical Exam  Nursing note and vitals reviewed. Constitutional: She appears well-developed and well-nourished. She appears lethargic. She appears ill. No distress.  HENT:  Head: Normocephalic and atraumatic.  Mouth/Throat: Oropharynx is clear and moist. Mucous membranes are dry.  Eyes: Conjunctivae are normal.  Neck: Normal range of motion. Neck supple.  Cardiovascular: Normal rate, regular rhythm and normal heart sounds.   Pulmonary/Chest: Effort normal and breath sounds normal. No  respiratory distress.  Diminished breath sounds at bases. Harsh cough.  Abdominal: Soft. Bowel sounds are normal. She exhibits no distension. There is no tenderness.  Musculoskeletal: Normal range of motion. She exhibits no edema.  Neurological: She appears lethargic. GCS eye subscore is 2. GCS verbal subscore is 4. GCS motor subscore is 4.  Oriented to person, not place or time.  Skin: Skin is warm and dry. She is not diaphoretic.  Psychiatric: She has a normal mood and affect. Her behavior is normal.    ED Course  Procedures (including critical care time) Labs Review Labs Reviewed  CBC WITH DIFFERENTIAL - Abnormal; Notable for the following:    Platelets 128 (*)    Lymphs Abs 0.6 (*)    Monocytes Relative 32 (*)    Monocytes Absolute 1.5 (*)    All other components within normal limits  COMPREHENSIVE METABOLIC PANEL - Abnormal; Notable for the following:    Glucose, Bld 129 (*)    BUN 31 (*)    Creatinine, Ser 1.33 (*)    Albumin 3.0 (*)    Total Bilirubin 0.2 (*)    GFR calc non Af Amer 34 (*)    GFR calc Af Amer 40 (*)    All other components within normal limits  URINALYSIS, ROUTINE W REFLEX MICROSCOPIC - Abnormal; Notable for the following:    APPearance CLOUDY (*)    Hgb urine dipstick TRACE (*)    Leukocytes, UA SMALL (*)    All other components within normal limits  URINE MICROSCOPIC-ADD ON - Abnormal; Notable for the following:    Bacteria, UA MANY (*)    All other components within normal limits  CULTURE, BLOOD (ROUTINE X 2)  CULTURE, BLOOD (ROUTINE X 2)  URINE CULTURE  I-STAT CG4 LACTIC ACID, ED  CBG MONITORING, ED   Imaging Review Dg Chest Portable 1 View  05/18/2013   CLINICAL DATA:  Cough for 1 week, bruising to the left shoulder, altered mental status  EXAM: PORTABLE CHEST - 1 VIEW  COMPARISON:  Portable exam 1941 hr compared to 09/20/2012  FINDINGS: Normal heart size and pulmonary vascularity.  Atherosclerotic calcification aorta.  Right infrahilar focal  opacity question mass versus infiltrate, poorly defined, question 5 x 4 cm.  Additional nodular density however in the lower right chest is also seen, 2.2 x 2.2 cm, also suspicious for mass.  Bronchitic changes and scattered  initial prominence in the mid to lower lungs noted.  Mild left basilar atelectasis.  Remaining lungs clear.  No gross pleural effusion or pneumothorax.  Bones demineralized.  IMPRESSION: Chronic bronchitic interstitial changes with right infrahilar opacity and additional right lung base nodule, worrisome for tumor.  Area of focal opacity more peripherally in the right lung on the prior CT is no longer identified.  If clinically indicated these could be better evaluated by CT chest.   Electronically Signed   By: Ulyses Southward M.D.   On: 05/18/2013 20:03    EKG Interpretation   None       MDM   Final diagnoses:  Altered mental status  UTI (urinary tract infection)  HAP (hospital-acquired pneumonia)    Pt presenting with AMS, febrile 101.8. Pt may have UTI and/or pneumonia. O2 sat 90% on RA, 96% on 2L. She does not use oxygen at home. Other members of nursing home are sick. Sepsis workup labs pending. CXR pending. 9:45 PM Labs, urine showing positive UTI. No leukocytosis, normal lactate. CXR showing chronic bronchitic interstitial changes with right infrahilar opacity. Will start vanc and cefepime for both. Pt will be admitted. She remains stable. Daughter unsure if she is DNR, daughter is power of attorney. Admission accepted by Dr. Toniann Fail, [facility]. Case discussed with attending Dr. Fredderick Phenix who agrees with plan of care.    Trevor Mace, PA-C 05/18/13 2148

## 2013-05-18 NOTE — H&P (Signed)
[facility] History and Physical  Kaylee Braun ZOX:[id number] DOB: June 12, 1921 DOA: 05/18/2013  Referring physician: ER physician. PCP: Pearla Dubonnet, MD   History obtained from patient's daughter and previous records and ER physician.  Chief Complaint: Altered mental status.  HPI: Kaylee Braun is a 80 y.o. female with history of hypertension, previous stroke, dementia and the right lung mass was brought to the ER patient was found to be increasingly confused since yesterday. As per patient's daughter patient has been getting increasingly confused since yesterday with mild productive cough. Patient did not have any associated nausea vomiting abdominal pain or diarrhea. In the ER patient's labs reveal UTI. Chest x-ray shows known right lung opacity but given patient's productive cough patient has been started on empiric antibiotics for UTI and possible pneumonia. On my exam patient was lethargic and follows minimal commands. Patient also has bruise on her left shoulder and does not move her left lower extremity. Patient has had a fall 2 weeks ago. At this time patient's daughter who is also the healthcare power of attorney does not want any aggressive measures and wants minimal intervention and okay with IV fluids antibiotics for now.   Review of Systems: As presented in the history of presenting illness, rest negative.  Past Medical History  Diagnosis Date  . Stroke   . Diverticulitis   . Hyperparathyroidism   . DJD (degenerative joint disease)   . Dementia    History reviewed. No pertinent past surgical history. Social History:  reports that she has never smoked. She does not have any smokeless tobacco history on file. She reports that she does not drink alcohol. Her drug history is not on file. Where does patient live nursing home. Can patient participate in ADLs? No.  Allergies  Allergen Reactions  . Coconut Oil     NH MAR  . Oysters [Shellfish Allergy]     NH MAR     Family History: History reviewed. No pertinent family history.    Prior to Admission medications   Medication Sig Start Date End Date Taking? Authorizing Provider  acetaminophen (TYLENOL) 325 MG tablet Take 650 mg by mouth every 4 (four) hours as needed. For pain   Yes Historical Provider, MD  alum & mag hydroxide-simeth (MINTOX) 200-200-20 MG/5ML suspension Take 20 mLs by mouth as needed. For heartburn -  not to exceed 3 doses in 24 hours   Yes Historical Provider, MD  Calcium Carbonate-Vitamin D (CALTRATE 600+D) 600-400 MG-UNIT per tablet Take 1 tablet by mouth 2 (two) times daily.   Yes Historical Provider, MD  diltiazem (DILACOR XR) 120 MG 24 hr capsule Take 120 mg by mouth daily.   Yes Historical Provider, MD  DULoxetine (CYMBALTA) 30 MG capsule Take 30 mg by mouth daily.   Yes Historical Provider, MD  guaiFENesin-dextromethorphan (ROBITUSSIN DM) 100-10 MG/5ML syrup Take 5 mLs by mouth 3 (three) times daily as needed for cough.   Yes Historical Provider, MD  hydrocortisone cream 1 % Apply 1 application topically 2 (two) times daily as needed. For rash   Yes Historical Provider, MD  loratadine (CLARITIN) 10 MG tablet Take 10 mg by mouth daily as needed (sneezing).   Yes Historical Provider, MD  polyethylene glycol (MIRALAX / GLYCOLAX) packet Take 17 g by mouth daily.   Yes Historical Provider, MD  sodium chloride (OCEAN) 0.65 % nasal spray Place 2 sprays into both nostrils 3 (three) times daily as needed. For nasal congestion   Yes Historical Provider, MD  triamcinolone cream (KENALOG) 0.1 % Apply 1 application topically 2 (two) times daily as needed. For rash on neck   Yes Historical Provider, MD  valsartan-hydrochlorothiazide (DIOVAN-HCT) 160-12.5 MG per tablet Take 0.5 tablets by mouth daily.   Yes Historical Provider, MD    Physical Exam: Filed Vitals:   05/18/13 1848 05/18/13 2015  BP: 128/58   Pulse: 90   Temp: 101.8 F (38.8 C) 99.1 F (37.3 C)  TempSrc: Rectal Rectal   Resp: 20   SpO2: 90%      General:  Well-developed and poorly nourished.  Eyes: Anicteric no pallor.  ENT: No discharge from ears eyes nose mouth.  Neck: No mass felt.  Cardiovascular: S1-S2 heard.  Respiratory: No rhonchi or crepitations.  Abdomen: Soft nontender bowel sounds present.  Skin: There is a bruise on the left shoulder and there is a scar on the left knee.  Musculoskeletal: No edema.  Psychiatric: Patient is lethargic.  Neurologic: Patient is lethargic.  Labs on Admission:  Basic Metabolic Panel:  Recent Labs Lab 05/18/13 1925  NA 137  K 4.1  CL 100  CO2 23  GLUCOSE 129*  BUN 31*  CREATININE 1.33*  CALCIUM 10.0   Liver Function Tests:  Recent Labs Lab 05/18/13 1925  AST 26  ALT 13  ALKPHOS 73  BILITOT 0.2*  PROT 6.8  ALBUMIN 3.0*   No results found for this basename: LIPASE, AMYLASE,  in the last 168 hours No results found for this basename: AMMONIA,  in the last 168 hours CBC:  Recent Labs Lab 05/18/13 1925  WBC 4.6  NEUTROABS 2.5  HGB 12.0  HCT 37.1  MCV 89.4  PLT 128*   Cardiac Enzymes: No results found for this basename: CKTOTAL, CKMB, CKMBINDEX, TROPONINI,  in the last 168 hours  BNP (last 3 results) No results found for this basename: PROBNP,  in the last 8760 hours CBG:  Recent Labs Lab 05/18/13 2012  GLUCAP 96    Radiological Exams on Admission: Dg Chest Portable 1 View  05/18/2013   CLINICAL DATA:  Cough for 1 week, bruising to the left shoulder, altered mental status  EXAM: PORTABLE CHEST - 1 VIEW  COMPARISON:  Portable exam 1941 hr compared to 09/20/2012  FINDINGS: Normal heart size and pulmonary vascularity.  Atherosclerotic calcification aorta.  Right infrahilar focal opacity question mass versus infiltrate, poorly defined, question 5 x 4 cm.  Additional nodular density however in the lower right chest is also seen, 2.2 x 2.2 cm, also suspicious for mass.  Bronchitic changes and scattered initial prominence  in the mid to lower lungs noted.  Mild left basilar atelectasis.  Remaining lungs clear.  No gross pleural effusion or pneumothorax.  Bones demineralized.  IMPRESSION: Chronic bronchitic interstitial changes with right infrahilar opacity and additional right lung base nodule, worrisome for tumor.  Area of focal opacity more peripherally in the right lung on the prior CT is no longer identified.  If clinically indicated these could be better evaluated by CT chest.   Electronically Signed   By: Ulyses SouthwardMark  Boles M.D.   On: 05/18/2013 20:03    Assessment/Plan Principal Problem:   Acute encephalopathy Active Problems:   UTI (lower urinary tract infection)   Lung mass   Dehydration   HTN (hypertension)   Thrombocytopenia   1. Acute encephalopathy most likely secondary to UTI and possible pneumonia - patient has been placed on vancomycin and cefepime which will be continued. Follow cultures. Continue gentle hydration. 2. Right lung  mass - patient has a known history of right lung mass and at this time patient's daughter is not seen any further intervention given patient's age and status. 3. Acute renal failure probably from dehydration - hold HCTZ. Continue with hydration. If creatinine further worsens may have to hold ARB. 4. Hypertension - see #3. 5. Chronic thrombocytopenia - closely follow CBC.  At this time patient's daughter wants minimal intervention and wants to make patient to be as comfortable as possible. At this time patient does not want any CT head or any further  Radiological procedures or ABG and wants to continue with IV fluids and antibiotics and see patient's progress.  Code Status: DO NOT RESUSCITATE.  Family Communication: Patient's daughter at the bedside.  Disposition Plan: Admit to inpatient under Dr. Marden Noble.    Deetya Drouillard N. [facility] Pager [phone].  If 7PM-7AM, please contact night-coverage [email or web address] Password [facility] 05/18/2013, 11:02  PM

## 2013-05-18 NOTE — ED Notes (Signed)
Patient daughter brought her in today due to AMS. [facility] Patient has not been herself for the last 2 days per daughter.

## 2013-05-18 NOTE — ED Provider Notes (Signed)
Medical screening examination/treatment/procedure(s) were conducted as a shared visit with non-physician practitioner(s) and myself.  I personally evaluated the patient during the encounter.  EKG Interpretation   None       Pt with altered MS.  Is awake, more confused than normal per family.  Evidence of pneumonia and UTI.  Will admit to hospitalist.  Rolan BuccoMelanie Demetress Tift, MD 05/18/13 [phone]

## 2013-05-18 NOTE — ED Notes (Signed)
On assessment patient very sleepy will wake but not answering questions. Patient daughter present at bedside.

## 2013-05-19 LAB — CBC
HCT: 35.6 % — ABNORMAL LOW (ref 36.0–46.0)
HEMOGLOBIN: 11.5 g/dL — AB (ref 12.0–15.0)
MCH: 29.2 pg (ref 26.0–34.0)
MCHC: 32.3 g/dL (ref 30.0–36.0)
MCV: 90.4 fL (ref 78.0–100.0)
Platelets: 111 10*3/uL — ABNORMAL LOW (ref 150–400)
RBC: 3.94 MIL/uL (ref 3.87–5.11)
RDW: 14.4 % (ref 11.5–15.5)
WBC: 4.4 10*3/uL (ref 4.0–10.5)

## 2013-05-19 LAB — BASIC METABOLIC PANEL
BUN: 23 mg/dL (ref 6–23)
CO2: 25 meq/L (ref 19–32)
CREATININE: 1.12 mg/dL — AB (ref 0.50–1.10)
Calcium: 8.9 mg/dL (ref 8.4–10.5)
Chloride: 104 mEq/L (ref 96–112)
GFR calc Af Amer: 49 mL/min — ABNORMAL LOW (ref >90)
GFR calc non Af Amer: 42 mL/min — ABNORMAL LOW (ref >90)
GLUCOSE: 91 mg/dL (ref 70–99)
Potassium: 4.1 mEq/L (ref 3.7–5.3)
Sodium: 139 mEq/L (ref 137–147)

## 2013-05-19 MED ORDER — VANCOMYCIN HCL 500 MG IV SOLR
500.0000 mg | INTRAVENOUS | Status: DC
  Administered 2013-05-19: 500 mg via INTRAVENOUS
  Filled 2013-05-19: qty 500

## 2013-05-19 MED ORDER — DEXTROSE 5 % IV SOLN
1.0000 g | INTRAVENOUS | Status: DC
  Administered 2013-05-19: 1 g via INTRAVENOUS
  Filled 2013-05-19: qty 1

## 2013-05-19 MED ORDER — ENOXAPARIN SODIUM 30 MG/0.3ML ~~LOC~~ SOLN
30.0000 mg | SUBCUTANEOUS | Status: DC
  Administered 2013-05-20 – 2013-05-23 (×4): 30 mg via SUBCUTANEOUS
  Filled 2013-05-19 (×4): qty 0.3

## 2013-05-19 NOTE — Progress Notes (Signed)
CRITICAL VALUE ALERT  Critical value received:  + BC: aerobic bottle gram + cocci in clusters  Date of notification:  05/19/13  Time of notification:  2332  Critical value read back:yes  Nurse who received alert:  S.Young,RN  MD notified (1st page):  Dr. Pete GlatterStoneking  Time of first page:  2333  MD notified (2nd page):  Time of second page:  Responding MD:  Dr. Pete GlatterStoneking  Time MD responded:  2337

## 2013-05-19 NOTE — Progress Notes (Signed)
ANTIBIOTIC CONSULT NOTE - INITIAL  Pharmacy Consult for Vancomycin/cefepime Indication: HCAP  Allergies  Allergen Reactions  . Coconut Oil     NH MAR  . Oysters [Shellfish Allergy]     NH MAR    Patient Measurements: Height: 5\' 4"  (162.6 cm) Weight: 106 lb 14.8 oz (48.5 kg) IBW/kg (Calculated) : 54.7 Adjusted Body Weight:   Vital Signs: Temp: 98.8 F (37.1 C) (02/23 0026) Temp src: Axillary (02/23 0026) BP: 139/73 mmHg (02/23 0026) Pulse Rate: 78 (02/23 0026) Intake/Output from previous day: 02/22 0701 - 02/23 0700 In: 75 [I.V.:75] Out: -  Intake/Output from this shift: Total I/O In: 75 [I.V.:75] Out: -   Labs:  Recent Labs  05/18/13 1925  WBC 4.6  HGB 12.0  PLT 128*  CREATININE 1.33*   Estimated Creatinine Clearance: 22 ml/min (by C-G formula based on Cr of 1.33). No results found for this basename: VANCOTROUGH, VANCOPEAK, VANCORANDOM, GENTTROUGH, GENTPEAK, GENTRANDOM, TOBRATROUGH, TOBRAPEAK, TOBRARND, AMIKACINPEAK, AMIKACINTROU, AMIKACIN,  in the last 72 hours   Microbiology: No results found for this or any previous visit (from the past 720 hour(s)).  Medical History: Past Medical History  Diagnosis Date  . Stroke   . Diverticulitis   . Hyperparathyroidism   . DJD (degenerative joint disease)   . Dementia     Medications:  Anti-infectives   Start     Dose/Rate Route Frequency Ordered Stop   05/19/13 2200  vancomycin (VANCOCIN) 500 mg in sodium chloride 0.9 % 100 mL IVPB     500 mg 100 mL/hr over 60 Minutes Intravenous Every 24 hours 05/19/13 0214     05/19/13 2200  ceFEPIme (MAXIPIME) 1 g in dextrose 5 % 50 mL IVPB     1 g 100 mL/hr over 30 Minutes Intravenous Every 24 hours 05/19/13 0214     05/18/13 2130  ceFEPIme (MAXIPIME) 2 g in dextrose 5 % 50 mL IVPB     2 g 100 mL/hr over 30 Minutes Intravenous  Once 05/18/13 2124 05/18/13 2216   05/18/13 2130  vancomycin (VANCOCIN) IVPB 1000 mg/200 mL premix     1,000 mg 200 mL/hr over 60 Minutes  Intravenous  Once 05/18/13 2124 05/18/13 2320     Assessment: Patient with HCAP.  First dose of antibiotics already given.  Patient with poor renal function.  Goal of Therapy:  Vancomycin trough level 15-20 mcg/ml Cefepime dosed based on patient weight and renal function   Plan:  Measure antibiotic drug levels at steady state Follow up culture results Vancomycin 500mg  iv q24hr Cefepime 1gm iv q24hr  Darlina GuysGrimsley Jr, Delman Goshorn Crowford 05/19/2013,2:17 AM

## 2013-05-19 NOTE — Progress Notes (Signed)
Clinical Social Work Department BRIEF PSYCHOSOCIAL ASSESSMENT 05/19/2013  Patient:  Kaylee Braun,Kaylee Braun     Account Number:  [id number]     Admit date:  05/18/2013  Clinical Social Worker:  Hattie PerchORCORAN,Mikiala Fugett, LCSW  Date/Time:  05/19/2013 12:00 M  Referred by:  Physician  Date Referred:  05/19/2013 Referred for  ALF Placement   Other Referral:   Interview type:  Family Other interview type:    PSYCHOSOCIAL DATA Living Status:  FACILITY [facility] [place] Level of care:  Assisted Living Primary support name:  Kaylee Braun Primary support relationship to patient:  CHILD, ADULT Degree of support available:   good    CURRENT CONCERNS Current Concerns  Post-Acute Placement   Other Concerns:    SOCIAL WORK ASSESSMENT / PLAN Patient is unable to participate in assessment due to mental status. CSW called patient's daughter, Kaylee Braun, she confirms that [facility] is a [facility] and she wishes her to return there. She went on to say that she is so glad that she took patient to the emergency room because she had no idea that patient had so many infections. She wants patient to return to [facility] because she said it is nice and it is expensive but she hopes they keep a closer eye on patient so they can intercede with something like this in the future.   Assessment/plan status:   Other assessment/ plan:   Information/referral to community resources:    PATIENT'S/FAMILY'S RESPONSE TO PLAN OF CARE: daughter is hopeful that after patient's medical status resolves that she will be able to return to [facility] as she does like that facility.

## 2013-05-19 NOTE — Progress Notes (Signed)
Subjective: Patient is confused but arousable. Recognizes me. Moderately lethargic but able to follow simple commands. Can move both feet request. Apparently could not move her left lower extremity last night.  Objective: Weight change:   Intake/Output Summary (Last 24 hours) at 05/19/13 0732 Last data filed at 05/19/13 0100  Gross per 24 hour  Intake     75 ml  Output      0 ml  Net     75 ml   Filed Vitals:   05/18/13 2015 05/19/13 0025 05/19/13 0026 05/19/13 0516  BP:   139/73 149/71  Pulse:   78 85  Temp: 99.1 F (37.3 C)  98.8 F (37.1 C) 99.5 F (37.5 C)  TempSrc: Rectal  Axillary Axillary  Resp:   20 20  Height:  5' 4"  (1.626 m)    Weight:   106 lb 14.8 oz (48.5 kg)   SpO2:  94% 97% 97%    General Appearance: Arousable but still somewhat lethargic. Oropharynx is dry. Follows simple commands Lungs: Clear to auscultation bilaterally, respirations unlabored Heart: Regular rate and rhythm, S1 and S2 normal, no murmur, rub or gallop Abdomen: Soft, non-tender, bowel sounds active all four quadrants, no masses, no organomegaly Extremities: Extremities normal, atraumatic, no cyanosis or edema Neuro: Oriented to name, does move all distal extremities  Lab Results: Results for orders placed during the hospital encounter of 05/18/13 (from the past 48 hour(s))  CBC WITH DIFFERENTIAL     Status: Abnormal   Collection Time    05/18/13  7:25 PM      Result Value Ref Range   WBC 4.6  4.0 - 10.5 K/uL   RBC 4.15  3.87 - 5.11 MIL/uL   Hemoglobin 12.0  12.0 - 15.0 g/dL   HCT 37.1  36.0 - 46.0 %   MCV 89.4  78.0 - 100.0 fL   MCH 28.9  26.0 - 34.0 pg   MCHC 32.3  30.0 - 36.0 g/dL   RDW 14.4  11.5 - 15.5 %   Platelets 128 (*) 150 - 400 K/uL   Neutrophils Relative % 54  43 - 77 %   Neutro Abs 2.5  1.7 - 7.7 K/uL   Lymphocytes Relative 13  12 - 46 %   Lymphs Abs 0.6 (*) 0.7 - 4.0 K/uL   Monocytes Relative 32 (*) 3 - 12 %   Monocytes Absolute 1.5 (*) 0.1 - 1.0 K/uL   Eosinophils  Relative 0  0 - 5 %   Eosinophils Absolute 0.0  0.0 - 0.7 K/uL   Basophils Relative 0  0 - 1 %   Basophils Absolute 0.0  0.0 - 0.1 K/uL  COMPREHENSIVE METABOLIC PANEL     Status: Abnormal   Collection Time    05/18/13  7:25 PM      Result Value Ref Range   Sodium 137  137 - 147 mEq/L   Potassium 4.1  3.7 - 5.3 mEq/L   Chloride 100  96 - 112 mEq/L   CO2 23  19 - 32 mEq/L   Glucose, Bld 129 (*) 70 - 99 mg/dL   BUN 31 (*) 6 - 23 mg/dL   Creatinine, Ser 1.33 (*) 0.50 - 1.10 mg/dL   Calcium 10.0  8.4 - 10.5 mg/dL   Total Protein 6.8  6.0 - 8.3 g/dL   Albumin 3.0 (*) 3.5 - 5.2 g/dL   AST 26  0 - 37 U/L   Comment: SLIGHT HEMOLYSIS     HEMOLYSIS  AT THIS LEVEL MAY AFFECT RESULT   ALT 13  0 - 35 U/L   Alkaline Phosphatase 73  39 - 117 U/L   Total Bilirubin 0.2 (*) 0.3 - 1.2 mg/dL   GFR calc non Af Amer 34 (*) >90 mL/min   GFR calc Af Amer 40 (*) >90 mL/min   Comment: (NOTE)     The eGFR has been calculated using the CKD EPI equation.     This calculation has not been validated in all clinical situations.     eGFR's persistently <90 mL/min signify possible Chronic Kidney     Disease.  I-STAT CG4 LACTIC ACID, ED     Status: None   Collection Time    05/18/13  8:01 PM      Result Value Ref Range   Lactic Acid, Venous 1.93  0.5 - 2.2 mmol/L  CBG MONITORING, ED     Status: None   Collection Time    05/18/13  8:12 PM      Result Value Ref Range   Glucose-Capillary 96  70 - 99 mg/dL   Comment 1 Notify RN    URINALYSIS, ROUTINE W REFLEX MICROSCOPIC     Status: Abnormal   Collection Time    05/18/13  8:49 PM      Result Value Ref Range   Color, Urine YELLOW  YELLOW   APPearance CLOUDY (*) CLEAR   Specific Gravity, Urine 1.016  1.005 - 1.030   pH 6.0  5.0 - 8.0   Glucose, UA NEGATIVE  NEGATIVE mg/dL   Hgb urine dipstick TRACE (*) NEGATIVE   Bilirubin Urine NEGATIVE  NEGATIVE   Ketones, ur NEGATIVE  NEGATIVE mg/dL   Protein, ur NEGATIVE  NEGATIVE mg/dL   Urobilinogen, UA 0.2  0.0 -  1.0 mg/dL   Nitrite NEGATIVE  NEGATIVE   Leukocytes, UA SMALL (*) NEGATIVE  URINE MICROSCOPIC-ADD ON     Status: Abnormal   Collection Time    05/18/13  8:49 PM      Result Value Ref Range   Squamous Epithelial / LPF RARE  RARE   WBC, UA 11-20  <3 WBC/hpf   RBC / HPF 0-2  <3 RBC/hpf   Bacteria, UA MANY (*) RARE  BASIC METABOLIC PANEL     Status: Abnormal   Collection Time    05/19/13  5:10 AM      Result Value Ref Range   Sodium 139  137 - 147 mEq/L   Potassium 4.1  3.7 - 5.3 mEq/L   Chloride 104  96 - 112 mEq/L   CO2 25  19 - 32 mEq/L   Glucose, Bld 91  70 - 99 mg/dL   BUN 23  6 - 23 mg/dL   Creatinine, Ser 1.12 (*) 0.50 - 1.10 mg/dL   Calcium 8.9  8.4 - 10.5 mg/dL   GFR calc non Af Amer 42 (*) >90 mL/min   GFR calc Af Amer 49 (*) >90 mL/min   Comment: (NOTE)     The eGFR has been calculated using the CKD EPI equation.     This calculation has not been validated in all clinical situations.     eGFR's persistently <90 mL/min signify possible Chronic Kidney     Disease.  CBC     Status: Abnormal   Collection Time    05/19/13  5:10 AM      Result Value Ref Range   WBC 4.4  4.0 - 10.5 K/uL   RBC 3.94  3.87 - 5.11 MIL/uL   Hemoglobin 11.5 (*) 12.0 - 15.0 g/dL   HCT 35.6 (*) 36.0 - 46.0 %   MCV 90.4  78.0 - 100.0 fL   MCH 29.2  26.0 - 34.0 pg   MCHC 32.3  30.0 - 36.0 g/dL   RDW 14.4  11.5 - 15.5 %   Platelets 111 (*) 150 - 400 K/uL   Comment: SPECIMEN CHECKED FOR CLOTS     REPEATED TO VERIFY     PLATELET COUNT CONFIRMED BY SMEAR    Studies/Results: Dg Chest Portable 1 View  05/18/2013   CLINICAL DATA:  Cough for 1 week, bruising to the left shoulder, altered mental status  EXAM: PORTABLE CHEST - 1 VIEW  COMPARISON:  Portable exam 1941 hr compared to 09/20/2012  FINDINGS: Normal heart size and pulmonary vascularity.  Atherosclerotic calcification aorta.  Right infrahilar focal opacity question mass versus infiltrate, poorly defined, question 5 x 4 cm.  Additional nodular  density however in the lower right chest is also seen, 2.2 x 2.2 cm, also suspicious for mass.  Bronchitic changes and scattered initial prominence in the mid to lower lungs noted.  Mild left basilar atelectasis.  Remaining lungs clear.  No gross pleural effusion or pneumothorax.  Bones demineralized.  IMPRESSION: Chronic bronchitic interstitial changes with right infrahilar opacity and additional right lung base nodule, worrisome for tumor.  Area of focal opacity more peripherally in the right lung on the prior CT is no longer identified.  If clinically indicated these could be better evaluated by CT chest.   Electronically Signed   By: Lavonia Dana M.D.   On: 05/18/2013 20:03   Medications: Scheduled Meds: . calcium-vitamin D  1 tablet Oral BID  . ceFEPime (MAXIPIME) IV  1 g Intravenous Q24H  . diltiazem  120 mg Oral Daily  . DULoxetine  30 mg Oral Daily  . enoxaparin (LOVENOX) injection  40 mg Subcutaneous Q24H  . irbesartan  150 mg Oral Daily  . polyethylene glycol  17 g Oral Daily  . vancomycin  500 mg Intravenous Q24H   Continuous Infusions: . sodium chloride 75 mL/hr at 05/19/13 0100   PRN Meds:.acetaminophen, acetaminophen, alum & mag hydroxide-simeth, guaiFENesin-dextromethorphan, hydrocortisone cream, loratadine, ondansetron (ZOFRAN) IV, ondansetron, triamcinolone cream  Assessment/Plan: Principal Problem:   Acute encephalopathy Active Problems:   UTI (lower urinary tract infection)   Lung mass   Dehydration   HTN (hypertension)   Thrombocytopenia  Principal Problem:  Acute encephalopathy - appears to be somewhat better with treatment of UTI and progress on rehydration Active Problems:  UTI (lower urinary tract infection) - continue antibiotic therapy as above Lung mass - aware, will consider CT, but will speak with family Dehydration - continue IV hydration today HTN (hypertension) - controlled Thrombocytopenia - aware at may be secondary to infection  1. Acute  encephalopathy most likely secondary to UTI and possible pneumonia - patient has been placed on vancomycin and cefepime which will be continued. Follow cultures. Continue gentle hydration. 2. Right lung mass - patient has a known history of right lung mass and at this time patient's daughter is not seen any further intervention given patient's age and status. 3. Acute renal failure probably from dehydration -  Continue to hold HCTZ. Continue with hydration. Check a bmet in morning Hypertension - see #3. 4. Chronic thrombocytopenia - closely follow CBC.  At this time patient's daughter wants minimal intervention and wants to make patient to be as comfortable as possible. At  this time patient does not want any CT head or any further radiological procedures or ABG and wants to continue with IV fluids and antibiotics and see patient's progress.   Code Status: DO NOT RESUSCITATE.   Family Communication: Will contact patient's daughter this a.m.    LOS: 1 day   Henrine Screws, MD 05/19/2013, 7:32 AM

## 2013-05-20 ENCOUNTER — Inpatient Hospital Stay (HOSPITAL_COMMUNITY): Payer: Medicare Other

## 2013-05-20 LAB — BASIC METABOLIC PANEL
BUN: 21 mg/dL (ref 6–23)
CHLORIDE: 99 meq/L (ref 96–112)
CO2: 19 meq/L (ref 19–32)
Calcium: 9 mg/dL (ref 8.4–10.5)
Creatinine, Ser: 0.89 mg/dL (ref 0.50–1.10)
GFR calc non Af Amer: 56 mL/min — ABNORMAL LOW (ref >90)
GFR, EST AFRICAN AMERICAN: 65 mL/min — AB (ref >90)
Glucose, Bld: 86 mg/dL (ref 70–99)
Potassium: 3.6 mEq/L — ABNORMAL LOW (ref 3.7–5.3)
Sodium: 133 mEq/L — ABNORMAL LOW (ref 137–147)

## 2013-05-20 LAB — URINE CULTURE: Colony Count: >=100000

## 2013-05-20 LAB — CBC WITH DIFFERENTIAL/PLATELET
BASOS ABS: 0 10*3/uL (ref 0.0–0.1)
Basophils Relative: 0 % (ref 0–1)
EOS PCT: 0 % (ref 0–5)
Eosinophils Absolute: 0 10*3/uL (ref 0.0–0.7)
HEMATOCRIT: 36.3 % (ref 36.0–46.0)
Hemoglobin: 12.1 g/dL (ref 12.0–15.0)
LYMPHS ABS: 0.9 10*3/uL (ref 0.7–4.0)
LYMPHS PCT: 15 % (ref 12–46)
MCH: 29.4 pg (ref 26.0–34.0)
MCHC: 33.3 g/dL (ref 30.0–36.0)
MCV: 88.3 fL (ref 78.0–100.0)
Monocytes Absolute: 1.2 10*3/uL — ABNORMAL HIGH (ref 0.1–1.0)
Monocytes Relative: 19 % — ABNORMAL HIGH (ref 3–12)
NEUTROS ABS: 4.2 10*3/uL (ref 1.7–7.7)
Neutrophils Relative %: 67 % (ref 43–77)
Platelets: 119 10*3/uL — ABNORMAL LOW (ref 150–400)
RBC: 4.11 MIL/uL (ref 3.87–5.11)
RDW: 14.2 % (ref 11.5–15.5)
WBC: 6.4 10*3/uL (ref 4.0–10.5)

## 2013-05-20 MED ORDER — KCL IN DEXTROSE-NACL 20-5-0.45 MEQ/L-%-% IV SOLN
INTRAVENOUS | Status: DC
  Administered 2013-05-20 – 2013-05-22 (×3): via INTRAVENOUS
  Filled 2013-05-20 (×5): qty 1000

## 2013-05-20 MED ORDER — LEVOFLOXACIN 250 MG PO TABS
250.0000 mg | ORAL_TABLET | Freq: Every day | ORAL | Status: DC
  Administered 2013-05-20: 250 mg via ORAL
  Filled 2013-05-20 (×2): qty 1

## 2013-05-20 MED ORDER — SACCHAROMYCES BOULARDII 250 MG PO CAPS
250.0000 mg | ORAL_CAPSULE | Freq: Two times a day (BID) | ORAL | Status: DC
  Administered 2013-05-20 – 2013-05-23 (×7): 250 mg via ORAL
  Filled 2013-05-20 (×8): qty 1

## 2013-05-20 NOTE — Progress Notes (Signed)
Subjective: Kaylee Braun is having some loose stools today and is still lethargic. IV fluids are not running. She has an Escherichia coli urinary tract infection, sensitivities are pending. May have vomited a small amount of clear fluid this morning. Not sure if this was coughed up or vomited. We'll check chest x-ray and also have speech therapy assess  Objective: Weight change:   Intake/Output Summary (Last 24 hours) at 05/20/13 0754 Last data filed at 05/19/13 0800  Gross per 24 hour  Intake      0 ml  Output      0 ml  Net      0 ml   Filed Vitals:   05/19/13 0516 05/19/13 1400 05/19/13 2157 05/20/13 0625  BP: 149/71 149/69 132/68 124/57  Pulse: 85 82 86 88  Temp: 99.5 F (37.5 C) 100 F (37.8 C) 97.9 F (36.6 C) 97.4 F (36.3 C)  TempSrc: Axillary Oral Oral Oral  Resp: 20 18 20 20   Height:      Weight:      SpO2: 97% 94% 94% 96%    General Appearance: Arousable but still lethargic. Oropharynx is dry. Actually more lethargic today than yesterday. IV fluids are only at [facility]  Lungs: to auscultation bilaterally, respirations unlabored.  Heart: Regular rate and rhythm, S1 and S2 normal, no murmur, rub or gallop  Abdomen: Soft, non-tender, bowel sounds active all four quadrants, no masses, no organomegaly  Extremities: Extremities normal, atraumatic, no cyanosis or edema  Neuro: Oriented to name, does move all distal extremities  Lab Results: Results for orders placed during the hospital encounter of 05/18/13 (from the past 48 hour(s))  CULTURE, BLOOD (ROUTINE X 2)     Status: None   Collection Time    05/18/13  7:25 PM      Result Value Ref Range   Specimen Description BLOOD LEFT FOREARM     Special Requests BOTTLES DRAWN AEROBIC AND ANAEROBIC 3CC     Culture  Setup Time       Value: 05/19/2013 00:54     Performed at [facility]   Culture       Value: [place] IN CLUSTERS     Note: Gram Stain Report Called to,Read Back By and Verified With: SHANNON YOUNG ON  05/19/2013 AT 11:31P BY WILEJ     Performed at [facility]   Report Status PENDING    CBC WITH DIFFERENTIAL     Status: Abnormal   Collection Time    05/18/13  7:25 PM      Result Value Ref Range   WBC 4.6  4.0 - 10.5 K/uL   RBC 4.15  3.87 - 5.11 MIL/uL   Hemoglobin 12.0  12.0 - 15.0 g/dL   HCT 37.1  36.0 - 46.0 %   MCV 89.4  78.0 - 100.0 fL   MCH 28.9  26.0 - 34.0 pg   MCHC 32.3  30.0 - 36.0 g/dL   RDW 14.4  11.5 - 15.5 %   Platelets 128 (*) 150 - 400 K/uL   Neutrophils Relative % 54  43 - 77 %   Neutro Abs 2.5  1.7 - 7.7 K/uL   Lymphocytes Relative 13  12 - 46 %   Lymphs Abs 0.6 (*) 0.7 - 4.0 K/uL   Monocytes Relative 32 (*) 3 - 12 %   Monocytes Absolute 1.5 (*) 0.1 - 1.0 K/uL   Eosinophils Relative 0  0 - 5 %   Eosinophils Absolute 0.0  0.0 - 0.7 K/uL   Basophils Relative 0  0 - 1 %   Basophils Absolute 0.0  0.0 - 0.1 K/uL  COMPREHENSIVE METABOLIC PANEL     Status: Abnormal   Collection Time    05/18/13  7:25 PM      Result Value Ref Range   Sodium 137  137 - 147 mEq/L   Potassium 4.1  3.7 - 5.3 mEq/L   Chloride 100  96 - 112 mEq/L   CO2 23  19 - 32 mEq/L   Glucose, Bld 129 (*) 70 - 99 mg/dL   BUN 31 (*) 6 - 23 mg/dL   Creatinine, Ser 1.33 (*) 0.50 - 1.10 mg/dL   Calcium 10.0  8.4 - 10.5 mg/dL   Total Protein 6.8  6.0 - 8.3 g/dL   Albumin 3.0 (*) 3.5 - 5.2 g/dL   AST 26  0 - 37 U/L   Comment: SLIGHT HEMOLYSIS     HEMOLYSIS AT THIS LEVEL MAY AFFECT RESULT   ALT 13  0 - 35 U/L   Alkaline Phosphatase 73  39 - 117 U/L   Total Bilirubin 0.2 (*) 0.3 - 1.2 mg/dL   GFR calc non Af Amer 34 (*) >90 mL/min   GFR calc Af Amer 40 (*) >90 mL/min   Comment: (NOTE)     The eGFR has been calculated using the CKD EPI equation.     This calculation has not been validated in all clinical situations.     eGFR's persistently <90 mL/min signify possible Chronic Kidney     Disease.  I-STAT CG4 LACTIC ACID, ED     Status: None   Collection Time    05/18/13  8:01 PM       Result Value Ref Range   Lactic Acid, Venous 1.93  0.5 - 2.2 mmol/L  CBG MONITORING, ED     Status: None   Collection Time    05/18/13  8:12 PM      Result Value Ref Range   Glucose-Capillary 96  70 - 99 mg/dL   Comment 1 Notify RN    URINALYSIS, ROUTINE W REFLEX MICROSCOPIC     Status: Abnormal   Collection Time    05/18/13  8:49 PM      Result Value Ref Range   Color, Urine YELLOW  YELLOW   APPearance CLOUDY (*) CLEAR   Specific Gravity, Urine 1.016  1.005 - 1.030   pH 6.0  5.0 - 8.0   Glucose, UA NEGATIVE  NEGATIVE mg/dL   Hgb urine dipstick TRACE (*) NEGATIVE   Bilirubin Urine NEGATIVE  NEGATIVE   Ketones, ur NEGATIVE  NEGATIVE mg/dL   Protein, ur NEGATIVE  NEGATIVE mg/dL   Urobilinogen, UA 0.2  0.0 - 1.0 mg/dL   Nitrite NEGATIVE  NEGATIVE   Leukocytes, UA SMALL (*) NEGATIVE  URINE CULTURE     Status: None   Collection Time    05/18/13  8:49 PM      Result Value Ref Range   Specimen Description URINE, CATHETERIZED     Special Requests NONE     Culture  Setup Time       Value: 05/19/2013 02:36     Performed at [facility]       Value: >=100,000 COLONIES/ML     Performed at [facility]   Culture       Value: Helen     Performed at [facility]  Report Status PENDING    URINE MICROSCOPIC-ADD ON     Status: Abnormal   Collection Time    05/18/13  8:49 PM      Result Value Ref Range   Squamous Epithelial / LPF RARE  RARE   WBC, UA 11-20  <3 WBC/hpf   RBC / HPF 0-2  <3 RBC/hpf   Bacteria, UA MANY (*) RARE  BASIC METABOLIC PANEL     Status: Abnormal   Collection Time    05/19/13  5:10 AM      Result Value Ref Range   Sodium 139  137 - 147 mEq/L   Potassium 4.1  3.7 - 5.3 mEq/L   Chloride 104  96 - 112 mEq/L   CO2 25  19 - 32 mEq/L   Glucose, Bld 91  70 - 99 mg/dL   BUN 23  6 - 23 mg/dL   Creatinine, Ser 1.12 (*) 0.50 - 1.10 mg/dL   Calcium 8.9  8.4 - 10.5 mg/dL   GFR calc non Af Amer 42 (*) >90 mL/min   GFR  calc Af Amer 49 (*) >90 mL/min   Comment: (NOTE)     The eGFR has been calculated using the CKD EPI equation.     This calculation has not been validated in all clinical situations.     eGFR's persistently <90 mL/min signify possible Chronic Kidney     Disease.  CBC     Status: Abnormal   Collection Time    05/19/13  5:10 AM      Result Value Ref Range   WBC 4.4  4.0 - 10.5 K/uL   RBC 3.94  3.87 - 5.11 MIL/uL   Hemoglobin 11.5 (*) 12.0 - 15.0 g/dL   HCT 35.6 (*) 36.0 - 46.0 %   MCV 90.4  78.0 - 100.0 fL   MCH 29.2  26.0 - 34.0 pg   MCHC 32.3  30.0 - 36.0 g/dL   RDW 14.4  11.5 - 15.5 %   Platelets 111 (*) 150 - 400 K/uL   Comment: SPECIMEN CHECKED FOR CLOTS     REPEATED TO VERIFY     PLATELET COUNT CONFIRMED BY SMEAR  BASIC METABOLIC PANEL     Status: Abnormal   Collection Time    05/20/13  4:50 AM      Result Value Ref Range   Sodium 133 (*) 137 - 147 mEq/L   Potassium 3.6 (*) 3.7 - 5.3 mEq/L   Chloride 99  96 - 112 mEq/L   CO2 19  19 - 32 mEq/L   Glucose, Bld 86  70 - 99 mg/dL   BUN 21  6 - 23 mg/dL   Creatinine, Ser 0.89  0.50 - 1.10 mg/dL   Calcium 9.0  8.4 - 10.5 mg/dL   GFR calc non Af Amer 56 (*) >90 mL/min   GFR calc Af Amer 65 (*) >90 mL/min   Comment: (NOTE)     The eGFR has been calculated using the CKD EPI equation.     This calculation has not been validated in all clinical situations.     eGFR's persistently <90 mL/min signify possible Chronic Kidney     Disease.  CBC WITH DIFFERENTIAL     Status: Abnormal   Collection Time    05/20/13  4:50 AM      Result Value Ref Range   WBC 6.4  4.0 - 10.5 K/uL   RBC 4.11  3.87 - 5.11 MIL/uL   Hemoglobin  12.1  12.0 - 15.0 g/dL   HCT 36.3  36.0 - 46.0 %   MCV 88.3  78.0 - 100.0 fL   MCH 29.4  26.0 - 34.0 pg   MCHC 33.3  30.0 - 36.0 g/dL   RDW 14.2  11.5 - 15.5 %   Platelets 119 (*) 150 - 400 K/uL   Comment: CONSISTENT WITH PREVIOUS RESULT   Neutrophils Relative % 67  43 - 77 %   Neutro Abs 4.2  1.7 - 7.7 K/uL    Lymphocytes Relative 15  12 - 46 %   Lymphs Abs 0.9  0.7 - 4.0 K/uL   Monocytes Relative 19 (*) 3 - 12 %   Monocytes Absolute 1.2 (*) 0.1 - 1.0 K/uL   Eosinophils Relative 0  0 - 5 %   Eosinophils Absolute 0.0  0.0 - 0.7 K/uL   Basophils Relative 0  0 - 1 %   Basophils Absolute 0.0  0.0 - 0.1 K/uL    Studies/Results: Dg Chest Portable 1 View  05/18/2013   CLINICAL DATA:  Cough for 1 week, bruising to the left shoulder, altered mental status  EXAM: PORTABLE CHEST - 1 VIEW  COMPARISON:  Portable exam 1941 hr compared to 09/20/2012  FINDINGS: Normal heart size and pulmonary vascularity.  Atherosclerotic calcification aorta.  Right infrahilar focal opacity question mass versus infiltrate, poorly defined, question 5 x 4 cm.  Additional nodular density however in the lower right chest is also seen, 2.2 x 2.2 cm, also suspicious for mass.  Bronchitic changes and scattered initial prominence in the mid to lower lungs noted.  Mild left basilar atelectasis.  Remaining lungs clear.  No gross pleural effusion or pneumothorax.  Bones demineralized.  IMPRESSION: Chronic bronchitic interstitial changes with right infrahilar opacity and additional right lung base nodule, worrisome for tumor.  Area of focal opacity more peripherally in the right lung on the prior CT is no longer identified.  If clinically indicated these could be better evaluated by CT chest.   Electronically Signed   By: Lavonia Dana M.D.   On: 05/18/2013 20:03   Medications: Scheduled Meds: . calcium-vitamin D  1 tablet Oral BID  . diltiazem  120 mg Oral Daily  . DULoxetine  30 mg Oral Daily  . enoxaparin (LOVENOX) injection  30 mg Subcutaneous Q24H  . irbesartan  150 mg Oral Daily  . levofloxacin  250 mg Oral Daily  . polyethylene glycol  17 g Oral Daily  . saccharomyces boulardii  250 mg Oral BID   Continuous Infusions: . dextrose 5 % and 0.45 % NaCl with KCl 20 mEq/L     PRN Meds:.acetaminophen, acetaminophen, alum & mag  hydroxide-simeth, guaiFENesin-dextromethorphan, hydrocortisone cream, loratadine, ondansetron (ZOFRAN) IV, ondansetron, triamcinolone cream  Assessment/Plan: Principal Problem:   Acute encephalopathy Active Problems:   UTI (lower urinary tract infection)   Lung mass   Dehydration   HTN (hypertension)   Thrombocytopenia  Principal Problem:  Acute encephalopathy - still very lethargic but appears to be somewhat better with treatment of UTI and need to continue on with rehydration Active Problems:  UTI (lower urinary tract infection) - Escherichia coli urinary tract infection, greater than 100,000 colonies, switched to by mouth Levaquin today Lung mass - aware, will consider CT, but will speak with family -  family has been not interested in working up further Dehydration - resume IV hydration today  HTN (hypertension) - controlled  At this time patient's daughter wants minimal intervention and wants  to make patient to be as comfortable as possible. At this time patient does not want any CT head or any further radiological procedures or ABG and wants to continue with IV fluids and antibiotics and see patient's progress. Speech therapy will be consulted to make sure that she can swallow safely  Code Status: DO NOT RESUSCITATE.  Family Communication: Spoke at length with patient's daughter this morning. She would like to transition her back to her assisted living facility if at all possible    LOS: 2 days   Henrine Screws, MD 05/20/2013, 7:54 AM

## 2013-05-20 NOTE — Evaluation (Signed)
Clinical/Bedside Swallow Evaluation Patient Details  Name: Kaylee Braun MRN: [id number] Date of Birth: [birth year]  Today's Date: 05/20/2013 Time: 4098-1191 SLP Time Calculation (min): 31 min  Past Medical History:  Past Medical History  Diagnosis Date  . Stroke   . Diverticulitis   . Hyperparathyroidism   . DJD (degenerative joint disease)   . Dementia    Past Surgical History: History reviewed. No pertinent past surgical history. HPI:  80 yo resident of [facility] admitted [facility] with AMS diagnosed with acute encephalopathy.  Pt CXR showed chronic bronchitis and right base opacity worrisome for tumor.  Pt with ? vomiting or coughing up clear fluid and recent CXR today showed bronchitic changes, atelectasis vs left base infiltrate 05/20/13.     Assessment / Plan / Recommendation Clinical Impression  Patient presents with moderate oral dysphagia and question esophageal component based on clinical symptoms/findings.  Pt has very audible swallow which may indicate cricopharyngeus issue.  Pt accepted yogurt with strawberries, water, cranberry juice and saltine cracker.   Slow mastication with cracker and oral stasis apparent - family ordering for pt what appears to be appropriate items.  Overt cough noted when pt took water when solids not cleared from mouth.  However pt tolerated liquids well when mouth was clear.  Use of straw versus cup dd not change swallow ability based on clinical evaluation.  Note pt with facial nerve deficit impacting left side also ? vagus nerve involvement  due to pt's weak voice and hoarseness.  She was fully alert during this evaluation which was helpful to maximize airway protection.  Recommend continue puree/thin diet with full supervision.  Will follow up one time for family education.      Aspiration Risk  Moderate    Diet Recommendation Dysphagia 1 (Puree);Thin liquid   Liquid Administration via: Cup;Straw Medication Administration: Crushed with  puree Supervision: Full supervision/cueing for compensatory strategies Compensations: Slow rate;Small sips/bites;Check for pocketing Postural Changes and/or Swallow Maneuvers: Seated upright 90 degrees;Upright 30-60 min after meal    Other  Recommendations Oral Care Recommendations: Oral care before and after PO   Follow Up Recommendations    SNF for brief follow up to assure pt at baseline   Frequency and Duration min 1 x/week  1 week   Pertinent Vitals/Pain Afebrile, decreased      Swallow Study Prior Functional Status   Pt at [facility] on [address] per Char[facility] fundraiser.     General Date of Onset: 05/20/13 HPI: 80 yo resident of [facility] with AMS diagnosed with acute encephalopathy.  Pt CXR showed chronic bronchitis and right base opacity worrisome for tumor.  Pt with ? vomiting or coughing up clear fluid and recent CXR today showed bronchitic changes, atelectasis vs left base infiltrate 05/20/13.   Type of Study: Bedside swallow evaluation Diet Prior to this Study: Regular;Thin liquids Temperature Spikes Noted: No Respiratory Status: Room air History of Recent Intubation: No Behavior/Cognition: Alert;Distractible;Doesn't follow directions;Decreased sustained attention Oral Cavity - Dentition: Adequate natural dentition Self-Feeding Abilities: Total assist Patient Positioning: Postural control interferes with function (pt leaning to the left and has left facial droop, per RN conversation with daughter - facial droop is baseline) Baseline Vocal Quality: Aphonic;Low vocal intensity Volitional Cough: Cognitively unable to elicit Volitional Swallow: Unable to elicit    Oral/Motor/Sensory Function Overall Oral Motor/Sensory Function:  (cognitive deficits make pt following directions difficult) Labial ROM: Reduced left Labial Symmetry: Abnormal symmetry left Labial Strength: Reduced Lingual ROM: Reduced left  Lingual Strength: Reduced Facial ROM: Reduced  left Facial Symmetry: Left droop Facial Strength: Reduced Velum: Within Functional Limits Mandible: Within Functional Limits   Ice Chips Ice chips: Not tested   Thin Liquid Thin Liquid: Impaired Presentation: Straw;Cup Oral Phase Impairments: Impaired anterior to posterior transit;Reduced lingual movement/coordination Pharyngeal  Phase Impairments: Cough - Immediate Other Comments: cough with swallow when pt had oral residue of cracker    Nectar Thick Nectar Thick Liquid: Not tested   Honey Thick Honey Thick Liquid: Not tested   Puree Puree: Impaired Presentation: Self Fed;Spoon Oral Phase Impairments: Reduced lingual movement/coordination;Impaired anterior to posterior transit Oral Phase Functional Implications: Prolonged oral transit Other Comments: AUDIBLE swallow   Solid   GO    Solid: Impaired Presentation: Self Fed Oral Phase Impairments: Impaired anterior to posterior transit;Reduced lingual movement/coordination;Poor awareness of bolus Oral Phase Functional Implications: Oral residue       Donavan Burnetamara Cherlyn Syring, MS [facility] SLP [phone]

## 2013-05-21 LAB — BASIC METABOLIC PANEL
BUN: 19 mg/dL (ref 6–23)
CALCIUM: 9.3 mg/dL (ref 8.4–10.5)
CHLORIDE: 102 meq/L (ref 96–112)
CO2: 23 meq/L (ref 19–32)
Creatinine, Ser: 0.91 mg/dL (ref 0.50–1.10)
GFR calc Af Amer: 63 mL/min — ABNORMAL LOW (ref >90)
GFR calc non Af Amer: 54 mL/min — ABNORMAL LOW (ref >90)
GLUCOSE: 103 mg/dL — AB (ref 70–99)
Potassium: 3.9 mEq/L (ref 3.7–5.3)
Sodium: 137 mEq/L (ref 137–147)

## 2013-05-21 LAB — CBC WITH DIFFERENTIAL/PLATELET
Basophils Absolute: 0 10*3/uL (ref 0.0–0.1)
Basophils Relative: 0 % (ref 0–1)
EOS ABS: 0 10*3/uL (ref 0.0–0.7)
EOS PCT: 0 % (ref 0–5)
HEMATOCRIT: 32.8 % — AB (ref 36.0–46.0)
Hemoglobin: 11 g/dL — ABNORMAL LOW (ref 12.0–15.0)
LYMPHS ABS: 1 10*3/uL (ref 0.7–4.0)
LYMPHS PCT: 20 % (ref 12–46)
MCH: 29.4 pg (ref 26.0–34.0)
MCHC: 33.5 g/dL (ref 30.0–36.0)
MCV: 87.7 fL (ref 78.0–100.0)
MONO ABS: 0.9 10*3/uL (ref 0.1–1.0)
Monocytes Relative: 19 % — ABNORMAL HIGH (ref 3–12)
Neutro Abs: 2.9 10*3/uL (ref 1.7–7.7)
Neutrophils Relative %: 61 % (ref 43–77)
PLATELETS: 114 10*3/uL — AB (ref 150–400)
RBC: 3.74 MIL/uL — ABNORMAL LOW (ref 3.87–5.11)
RDW: 14.4 % (ref 11.5–15.5)
WBC: 4.8 10*3/uL (ref 4.0–10.5)

## 2013-05-21 MED ORDER — CEPHALEXIN 500 MG PO CAPS
500.0000 mg | ORAL_CAPSULE | Freq: Two times a day (BID) | ORAL | Status: DC
  Filled 2013-05-21 (×2): qty 1

## 2013-05-21 MED ORDER — DEXTROSE 5 % IV SOLN
1.0000 g | INTRAVENOUS | Status: DC
  Administered 2013-05-21: 1 g via INTRAVENOUS
  Filled 2013-05-21 (×2): qty 10

## 2013-05-21 MED ORDER — LEVOFLOXACIN 500 MG PO TABS
500.0000 mg | ORAL_TABLET | Freq: Every day | ORAL | Status: DC
  Administered 2013-05-21: 500 mg via ORAL
  Filled 2013-05-21: qty 1

## 2013-05-21 NOTE — Progress Notes (Signed)
Subjective: Patient more alert today. Still has congested cough. Had a small infiltrate in the left lower lobe on chest x-ray yesterday with 2 lung masses present. Likely malignancy.  Objective: Weight change:   Intake/Output Summary (Last 24 hours) at 05/21/13 0807 Last data filed at 05/20/13 2300  Gross per 24 hour  Intake 1170.67 ml  Output      0 ml  Net 1170.67 ml   Filed Vitals:   05/20/13 1415 05/20/13 2022 05/20/13 2242 05/21/13 0500  BP: 130/77  120/73   Pulse: 74  74   Temp: 97.3 F (36.3 C) 97.7 F (36.5 C) 98.8 F (37.1 C)   TempSrc: Oral  Oral   Resp: 18  18   Height:      Weight:    110 lb 0.2 oz (49.9 kg)  SpO2: 97%  95%    General Appearance: Arousable and mildly lethargic but better than yesterday. Oropharynx is moist.  Lungs: to auscultation bilaterally, respirations unlabored. Cough with some rhonchi Heart: Regular rate and rhythm, S1 and S2 normal, no murmur, rub or gallop  Abdomen: Soft, non-tender, bowel sounds active all four quadrants, no masses, no organomegaly  Extremities: Extremities normal, atraumatic, no cyanosis or edema  Neuro: Oriented to name, does move all distal extremities   Lab Results: Results for orders placed during the hospital encounter of 05/18/13 (from the past 48 hour(s))  BASIC METABOLIC PANEL     Status: Abnormal   Collection Time    05/20/13  4:50 AM      Result Value Ref Range   Sodium 133 (*) 137 - 147 mEq/L   Potassium 3.6 (*) 3.7 - 5.3 mEq/L   Chloride 99  96 - 112 mEq/L   CO2 19  19 - 32 mEq/L   Glucose, Bld 86  70 - 99 mg/dL   BUN 21  6 - 23 mg/dL   Creatinine, Ser 0.89  0.50 - 1.10 mg/dL   Calcium 9.0  8.4 - 10.5 mg/dL   GFR calc non Af Amer 56 (*) >90 mL/min   GFR calc Af Amer 65 (*) >90 mL/min   Comment: (NOTE)     The eGFR has been calculated using the CKD EPI equation.     This calculation has not been validated in all clinical situations.     eGFR's persistently <90 mL/min signify possible Chronic  Kidney     Disease.  CBC WITH DIFFERENTIAL     Status: Abnormal   Collection Time    05/20/13  4:50 AM      Result Value Ref Range   WBC 6.4  4.0 - 10.5 K/uL   RBC 4.11  3.87 - 5.11 MIL/uL   Hemoglobin 12.1  12.0 - 15.0 g/dL   HCT 36.3  36.0 - 46.0 %   MCV 88.3  78.0 - 100.0 fL   MCH 29.4  26.0 - 34.0 pg   MCHC 33.3  30.0 - 36.0 g/dL   RDW 14.2  11.5 - 15.5 %   Platelets 119 (*) 150 - 400 K/uL   Comment: CONSISTENT WITH PREVIOUS RESULT   Neutrophils Relative % 67  43 - 77 %   Neutro Abs 4.2  1.7 - 7.7 K/uL   Lymphocytes Relative 15  12 - 46 %   Lymphs Abs 0.9  0.7 - 4.0 K/uL   Monocytes Relative 19 (*) 3 - 12 %   Monocytes Absolute 1.2 (*) 0.1 - 1.0 K/uL   Eosinophils Relative 0  0 -  5 %   Eosinophils Absolute 0.0  0.0 - 0.7 K/uL   Basophils Relative 0  0 - 1 %   Basophils Absolute 0.0  0.0 - 0.1 K/uL  BASIC METABOLIC PANEL     Status: Abnormal   Collection Time    05/21/13  5:19 AM      Result Value Ref Range   Sodium 137  137 - 147 mEq/L   Potassium 3.9  3.7 - 5.3 mEq/L   Chloride 102  96 - 112 mEq/L   CO2 23  19 - 32 mEq/L   Glucose, Bld 103 (*) 70 - 99 mg/dL   BUN 19  6 - 23 mg/dL   Creatinine, Ser 0.91  0.50 - 1.10 mg/dL   Calcium 9.3  8.4 - 10.5 mg/dL   GFR calc non Af Amer 54 (*) >90 mL/min   GFR calc Af Amer 63 (*) >90 mL/min   Comment: (NOTE)     The eGFR has been calculated using the CKD EPI equation.     This calculation has not been validated in all clinical situations.     eGFR's persistently <90 mL/min signify possible Chronic Kidney     Disease.  CBC WITH DIFFERENTIAL     Status: Abnormal   Collection Time    05/21/13  5:19 AM      Result Value Ref Range   WBC 4.8  4.0 - 10.5 K/uL   RBC 3.74 (*) 3.87 - 5.11 MIL/uL   Hemoglobin 11.0 (*) 12.0 - 15.0 g/dL   HCT 32.8 (*) 36.0 - 46.0 %   MCV 87.7  78.0 - 100.0 fL   MCH 29.4  26.0 - 34.0 pg   MCHC 33.5  30.0 - 36.0 g/dL   RDW 14.4  11.5 - 15.5 %   Platelets 114 (*) 150 - 400 K/uL   Comment:  CONSISTENT WITH PREVIOUS RESULT   Neutrophils Relative % 61  43 - 77 %   Neutro Abs 2.9  1.7 - 7.7 K/uL   Lymphocytes Relative 20  12 - 46 %   Lymphs Abs 1.0  0.7 - 4.0 K/uL   Monocytes Relative 19 (*) 3 - 12 %   Monocytes Absolute 0.9  0.1 - 1.0 K/uL   Eosinophils Relative 0  0 - 5 %   Eosinophils Absolute 0.0  0.0 - 0.7 K/uL   Basophils Relative 0  0 - 1 %   Basophils Absolute 0.0  0.0 - 0.1 K/uL    Studies/Results: Dg Chest Port 1 View  05/20/2013   CLINICAL DATA:  Shortness of breath  EXAM: PORTABLE CHEST - 1 VIEW  COMPARISON:  Portable exam 0752 hr compared to 05/18/2013  FINDINGS: Stable heart size and pulmonary vascularity.  Atherosclerotic calcification aorta.  2 right perihilar masses are again identified, unchanged.  Left basilar atelectasis versus developing consolidation.  Mild bronchitic changes.  Remaining lungs grossly clear.  Bones demineralized.  No gross pleural effusion or pneumothorax.  IMPRESSION: Bronchitic changes with developing atelectasis versus infiltrate at left base.  Two mass like right perihilar opacities suspicious for tumor; further evaluation by CT chest recommended.   Electronically Signed   By: Lavonia Dana M.D.   On: 05/20/2013 08:35   Medications: Scheduled Meds: . calcium-vitamin D  1 tablet Oral BID  . diltiazem  120 mg Oral Daily  . DULoxetine  30 mg Oral Daily  . enoxaparin (LOVENOX) injection  30 mg Subcutaneous Q24H  . irbesartan  150 mg Oral Daily  .  levofloxacin  500 mg Oral Daily  . polyethylene glycol  17 g Oral Daily  . saccharomyces boulardii  250 mg Oral BID   Continuous Infusions: . dextrose 5 % and 0.45 % NaCl with KCl 20 mEq/L 50 mL/hr at 05/20/13 1856   PRN Meds:.acetaminophen, acetaminophen, alum & mag hydroxide-simeth, guaiFENesin-dextromethorphan, hydrocortisone cream, loratadine, ondansetron (ZOFRAN) IV, ondansetron, triamcinolone cream  Assessment/Plan: Principal Problem:   Acute encephalopathy Active Problems:   UTI  (lower urinary tract infection)   Lung mass   Dehydration   HTN (hypertension)   Thrombocytopenia   Left lower lobe infiltrate versus atelectasis   Dysphagia  Principal Problem:  Acute encephalopathy - lethargy improving with treatment of UTI and need to continue on with rehydration. Recognize me this morning  Active Problems:  UTI (lower urinary tract infection) - Escherichia coli urinary tract infection, greater than 100,000 colonies, switched to by mouth Levaquin today.   Lung mass - aware, will consider CT, but will speak with family - family has been not interested in working up further Left lower lobe infiltrate versus atelectasis - repeat chest x-ray in a.m.  Dehydration - continue IV hydration today and 50 cc an hour HTN (hypertension) - controlled  At this time patient's daughter wants minimal intervention and wants to make patient to be as comfortable as possible. At this time patient does not want any CT head or any further radiological procedures or ABG and wants to continue with IV fluids and antibiotics and see patient's progress.  Speech therapy has recommended dysphasia 1 thin liquid diet Code Status: DO NOT RESUSCITATE.  Family Communication: Spoke at length with patient's daughter this morning. She would like to transition her back to her assisted living facility if at all possible    LOS: 3 days   Henrine Screws, MD 05/21/2013, 8:07 AM

## 2013-05-21 NOTE — Progress Notes (Addendum)
Speech Language Pathology Treatment: Dysphagia  Patient Details Name: Kaylee Braun MRN: [id number] DOB: 10-31-1921 Today's Date: 05/21/2013    Assessment / Plan / Recommendation Clinical Impression  Pt seen today to assess tolerance of po diet and for pt/family education.  Family not present at this time but pt was fully alert and quite participative in therapy.  She answered many questions appropriately including about her previous career and eating/drinking.  Clear voice noted today.  Unfortunately, pt's improved mentation did not translate into improved swallow ability.  Pt noted today to cough with intake (no coughing noted before) predominately with liquids via straw and cup- even nectar thick liquids.  Pt with good tolerance of orange sherbert, Ensure and thin water via tsp only.   Recommend to decrease bolus size of liquid to tsps currently, hopeful for continued improvement with medical improvement.  Hydration concerns may be present with limited bolus size of liquids.    If Md desires, MBS may be completed to allow instrumental swallow assessment - however SLP does not anticipate this would change her outcomes/recommendations.   SLP posted signs regarding instructions to mitigate aspiration risk for this pleasant lady.  Recommend follow up SLP briefly at next venue of care for dysphagia management and hopefully dietary advancement as appropriate.    SLP to sign off as plans/strategies in place to mitigate aspiration risk, please re-consult if desired.    HPI HPI: 80 yo resident of [facility] admitted [facility] with AMS diagnosed with acute encephalopathy.  Pt CXR showed chronic bronchitis and right base opacity worrisome for tumor.  Pt with ? vomiting or coughing up clear fluid and recent CXR today showed bronchitic changes, atelectasis vs left base infiltrate 05/20/13.     Pertinent Vitals Afebrile,decreased  SLP Plan  All goals met    Recommendations Diet recommendations: Dysphagia 1  (puree);Thin liquid Liquids provided via: Teaspoon (liquids via tsp if decreases cough) Medication Administration: Crushed with puree Supervision: Full supervision/cueing for compensatory strategies;Staff to assist with self feeding Compensations: Slow rate;Small sips/bites;Check for pocketing (oral suction as needed) Postural Changes and/or Swallow Maneuvers: Seated upright 90 degrees;Upright 30-60 min after meal              Oral Care Recommendations: Oral care before and after PO Follow up Recommendations: Skilled Nursing facility Plan: All goals met    [place], [place], [facility] SLP [phone]

## 2013-05-22 ENCOUNTER — Inpatient Hospital Stay (HOSPITAL_COMMUNITY): Payer: Medicare Other

## 2013-05-22 LAB — BASIC METABOLIC PANEL
BUN: 14 mg/dL (ref 6–23)
CALCIUM: 9.4 mg/dL (ref 8.4–10.5)
CO2: 26 meq/L (ref 19–32)
CREATININE: 0.84 mg/dL (ref 0.50–1.10)
Chloride: 102 mEq/L (ref 96–112)
GFR calc Af Amer: 69 mL/min — ABNORMAL LOW (ref >90)
GFR, EST NON AFRICAN AMERICAN: 60 mL/min — AB (ref >90)
Glucose, Bld: 101 mg/dL — ABNORMAL HIGH (ref 70–99)
Potassium: 3.9 mEq/L (ref 3.7–5.3)
SODIUM: 137 meq/L (ref 137–147)

## 2013-05-22 LAB — CBC WITH DIFFERENTIAL/PLATELET
BASOS PCT: 1 % (ref 0–1)
Basophils Absolute: 0 10*3/uL (ref 0.0–0.1)
EOS PCT: 0 % (ref 0–5)
Eosinophils Absolute: 0 10*3/uL (ref 0.0–0.7)
HCT: 33.3 % — ABNORMAL LOW (ref 36.0–46.0)
Hemoglobin: 11.2 g/dL — ABNORMAL LOW (ref 12.0–15.0)
Lymphocytes Relative: 25 % (ref 12–46)
Lymphs Abs: 1 10*3/uL (ref 0.7–4.0)
MCH: 29.6 pg (ref 26.0–34.0)
MCHC: 33.6 g/dL (ref 30.0–36.0)
MCV: 88.1 fL (ref 78.0–100.0)
Monocytes Absolute: 0.6 10*3/uL (ref 0.1–1.0)
Monocytes Relative: 16 % — ABNORMAL HIGH (ref 3–12)
Neutro Abs: 2.4 10*3/uL (ref 1.7–7.7)
Neutrophils Relative %: 59 % (ref 43–77)
PLATELETS: 109 10*3/uL — AB (ref 150–400)
RBC: 3.78 MIL/uL — ABNORMAL LOW (ref 3.87–5.11)
RDW: 14 % (ref 11.5–15.5)
WBC: 4.1 10*3/uL (ref 4.0–10.5)

## 2013-05-22 LAB — CULTURE, BLOOD (ROUTINE X 2)

## 2013-05-22 MED ORDER — CLONIDINE HCL 0.3 MG PO TABS
0.3000 mg | ORAL_TABLET | Freq: Once | ORAL | Status: AC
  Administered 2013-05-22: 0.3 mg via ORAL
  Filled 2013-05-22: qty 1

## 2013-05-22 MED ORDER — CEFUROXIME AXETIL 500 MG PO TABS
500.0000 mg | ORAL_TABLET | Freq: Two times a day (BID) | ORAL | Status: DC
  Administered 2013-05-22 – 2013-05-23 (×2): 500 mg via ORAL
  Filled 2013-05-22 (×4): qty 1

## 2013-05-22 NOTE — Progress Notes (Addendum)
Pt's daughter at bedside.  Pt's blood pressure elevated at 171/82, no decrease after relaxation measures and quiet resting. Daughter concerned about the BP as well as other symptoms the pt is exhibiting, such as restlessness, pulling at lines and tubes, picking at clothing, etc.  Pt is also more confused than she was yesterday per daughter.  Notified Dr. Raelyn MoraJaralla Shamleffer of BP and situation.  New med orders received.  Will monitor.  Ardyth GalAnderson, Kaylem Gidney Ann, RN 05/22/2013  BP decreased to normal, please see doc flowsheets.  Ardyth GalAnderson, Keaden Gunnoe Ann, RN 05/22/2013

## 2013-05-22 NOTE — Progress Notes (Signed)
Subjective: Kaylee Braun is much more alert today. Able to comment about the [place] game 2 nights ago and she was very excited about [employer]'s victory. Recognizes me. No acute distress. Cough with rhonchi  Objective: Weight change:   Intake/Output Summary (Last 24 hours) at 05/22/13 0806 Last data filed at 05/22/13 0400  Gross per 24 hour  Intake   1620 ml  Output      0 ml  Net   1620 ml   Filed Vitals:   05/21/13 0500 05/21/13 1346 05/21/13 2114 05/22/13 0652  BP:  131/57 147/58 145/94  Pulse:  77 65 69  Temp:  97.8 F (36.6 C) 99 F (37.2 C) 99.9 F (37.7 C)  TempSrc:  Axillary Oral Oral  Resp:  20 18 18   Height:      Weight: 110 lb 0.2 oz (49.9 kg)     SpO2:  96% 95% 95%   General Appearance: Arousable and mildly lethargic but continues to improve. Oropharynx is moist.  Lungs: to auscultation bilaterally, respirations unlabored. Cough with mild rhonchi  Heart: Regular rate and rhythm, S1 and S2 normal, no murmur, rub or gallop  Abdomen: Soft, non-tender, bowel sounds active all four quadrants, no masses, no organomegaly  Extremities: Extremities normal, atraumatic, no cyanosis or edema  Neuro: Oriented to name, does move all distal extremities  Lab Results: Results for orders placed during the hospital encounter of 05/18/13 (from the past 48 hour(s))  BASIC METABOLIC PANEL     Status: Abnormal   Collection Time    05/21/13  5:19 AM      Result Value Ref Range   Sodium 137  137 - 147 mEq/L   Potassium 3.9  3.7 - 5.3 mEq/L   Chloride 102  96 - 112 mEq/L   CO2 23  19 - 32 mEq/L   Glucose, Bld 103 (*) 70 - 99 mg/dL   BUN 19  6 - 23 mg/dL   Creatinine, Ser 0.91  0.50 - 1.10 mg/dL   Calcium 9.3  8.4 - 10.5 mg/dL   GFR calc non Af Amer 54 (*) >90 mL/min   GFR calc Af Amer 63 (*) >90 mL/min   Comment: (NOTE)     The eGFR has been calculated using the CKD EPI equation.     This calculation has not been validated in all clinical situations.     eGFR's  persistently <90 mL/min signify possible Chronic Kidney     Disease.  CBC WITH DIFFERENTIAL     Status: Abnormal   Collection Time    05/21/13  5:19 AM      Result Value Ref Range   WBC 4.8  4.0 - 10.5 K/uL   RBC 3.74 (*) 3.87 - 5.11 MIL/uL   Hemoglobin 11.0 (*) 12.0 - 15.0 g/dL   HCT 32.8 (*) 36.0 - 46.0 %   MCV 87.7  78.0 - 100.0 fL   MCH 29.4  26.0 - 34.0 pg   MCHC 33.5  30.0 - 36.0 g/dL   RDW 14.4  11.5 - 15.5 %   Platelets 114 (*) 150 - 400 K/uL   Comment: CONSISTENT WITH PREVIOUS RESULT   Neutrophils Relative % 61  43 - 77 %   Neutro Abs 2.9  1.7 - 7.7 K/uL   Lymphocytes Relative 20  12 - 46 %   Lymphs Abs 1.0  0.7 - 4.0 K/uL   Monocytes Relative 19 (*) 3 - 12 %   Monocytes Absolute 0.9  0.1 -  1.0 K/uL   Eosinophils Relative 0  0 - 5 %   Eosinophils Absolute 0.0  0.0 - 0.7 K/uL   Basophils Relative 0  0 - 1 %   Basophils Absolute 0.0  0.0 - 0.1 K/uL  CBC WITH DIFFERENTIAL     Status: Abnormal   Collection Time    05/22/13  5:19 AM      Result Value Ref Range   WBC 4.1  4.0 - 10.5 K/uL   RBC 3.78 (*) 3.87 - 5.11 MIL/uL   Hemoglobin 11.2 (*) 12.0 - 15.0 g/dL   HCT 33.3 (*) 36.0 - 46.0 %   MCV 88.1  78.0 - 100.0 fL   MCH 29.6  26.0 - 34.0 pg   MCHC 33.6  30.0 - 36.0 g/dL   RDW 14.0  11.5 - 15.5 %   Platelets 109 (*) 150 - 400 K/uL   Comment: CONSISTENT WITH PREVIOUS RESULT   Neutrophils Relative % 59  43 - 77 %   Neutro Abs 2.4  1.7 - 7.7 K/uL   Lymphocytes Relative 25  12 - 46 %   Lymphs Abs 1.0  0.7 - 4.0 K/uL   Monocytes Relative 16 (*) 3 - 12 %   Monocytes Absolute 0.6  0.1 - 1.0 K/uL   Eosinophils Relative 0  0 - 5 %   Eosinophils Absolute 0.0  0.0 - 0.7 K/uL   Basophils Relative 1  0 - 1 %   Basophils Absolute 0.0  0.0 - 0.1 K/uL  BASIC METABOLIC PANEL     Status: Abnormal   Collection Time    05/22/13  5:19 AM      Result Value Ref Range   Sodium 137  137 - 147 mEq/L   Potassium 3.9  3.7 - 5.3 mEq/L   Chloride 102  96 - 112 mEq/L   CO2 26  19 - 32  mEq/L   Glucose, Bld 101 (*) 70 - 99 mg/dL   BUN 14  6 - 23 mg/dL   Creatinine, Ser 0.84  0.50 - 1.10 mg/dL   Calcium 9.4  8.4 - 10.5 mg/dL   GFR calc non Af Amer 60 (*) >90 mL/min   GFR calc Af Amer 69 (*) >90 mL/min   Comment: (NOTE)     The eGFR has been calculated using the CKD EPI equation.     This calculation has not been validated in all clinical situations.     eGFR's persistently <90 mL/min signify possible Chronic Kidney     Disease.    Studies/Results: Dg Chest Port 1 View  05/22/2013   CLINICAL DATA:  Evaluate infiltrate.  Dementia.  EXAM: PORTABLE CHEST - 1 VIEW  COMPARISON:  DG CHEST 1V PORT dated 05/20/2013; CT CHEST W/O CM dated 09/22/2012; DG CHEST 1V PORT dated 05/18/2013  FINDINGS: Midline trachea. Cardiomegaly accentuated by AP portable technique. Probable small left pleural effusion. No pneumothorax. Chronic interstitial thickening. Right perihilar and infrahilar opacity could represent tumor (given the appearance on prior CT) or an area of infection. Patchy left base airspace disease.  IMPRESSION: Similar left base infection or aspiration.  Right perihilar infrahilar opacity. Given appearance on prior CT and 05/18/2013 plain film, favored to represent carcinoma and adenopathy.   Electronically Signed   By: Abigail Miyamoto M.D.   On: 05/22/2013 07:56   Medications: Scheduled Meds: . calcium-vitamin D  1 tablet Oral BID  . cefUROXime  500 mg Oral BID WC  . diltiazem  120 mg  Oral Daily  . DULoxetine  30 mg Oral Daily  . enoxaparin (LOVENOX) injection  30 mg Subcutaneous Q24H  . irbesartan  150 mg Oral Daily  . polyethylene glycol  17 g Oral Daily  . saccharomyces boulardii  250 mg Oral BID   Continuous Infusions: . dextrose 5 % and 0.45 % NaCl with KCl 20 mEq/L 50 mL/hr at 05/21/13 2311   PRN Meds:.acetaminophen, acetaminophen, alum & mag hydroxide-simeth, guaiFENesin-dextromethorphan, hydrocortisone cream, loratadine, ondansetron (ZOFRAN) IV, ondansetron, triamcinolone  cream  Assessment/Plan: Principal Problem:   Acute encephalopathy - resolved, back to baseline Active Problems:   UTI (lower urinary tract infection) - Escherichia coli, sensitive to cephalosporins - because of pneumonia, will switch from cephalexin          to Ceftin for one more week of therapy   Left lower lobe pneumonia - possibly from aspiration   Dysphagia - patient requires a dysphagia 1 diet and OK to take thin liquids but only a teaspoon at a time.   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 Family aware. They do not want to pursue any further therapy for for this   Dehydration - resolving. Discontinue IV fluids in a.m.   HTN (hypertension) - controlled   Thrombocytopenia - stable   No CODE BLUE   Disposition - We'll plan to transfer by ambulance back to assisted living on 05/23/2013    LOS: 4 days   Henrine Screws, MD 05/22/2013, 8:06 AM

## 2013-05-23 MED ORDER — ACETAMINOPHEN 325 MG PO TABS: 650.0000 mg | ORAL_TABLET | Freq: Four times a day (QID) | ORAL | Status: AC | PRN

## 2013-05-23 MED ORDER — SACCHAROMYCES BOULARDII 250 MG PO CAPS: 250.0000 mg | ORAL_CAPSULE | Freq: Two times a day (BID) | ORAL | Status: AC

## 2013-05-23 MED ORDER — IRBESARTAN 150 MG PO TABS: 150.0000 mg | ORAL_TABLET | Freq: Every day | ORAL | Status: AC

## 2013-05-23 MED ORDER — CEFUROXIME AXETIL 500 MG PO TABS: 500.0000 mg | ORAL_TABLET | Freq: Two times a day (BID) | ORAL | Status: AC

## 2013-05-23 NOTE — Progress Notes (Signed)
Patient discharged, message left for Becky SaxBethany Corcoran, CSW regarding disposition

## 2013-05-23 NOTE — Progress Notes (Signed)
Left voice mail message for B. Merlene PullingCorcoran, CSW for [facility]

## 2013-05-23 NOTE — Discharge Summary (Signed)
Physician Discharge Summary  NAME:Kaylee Braun  WGN:[id number]  DOB: [birth year]   Admit date: 05/18/2013 Discharge date: 05/23/2013  Discharge Diagnoses:  Principal Problem:   Acute encephalopathy - resolved, back to baseline Active Problems:  UTI (lower urinary tract infection) - Escherichia coli, sensitive to cephalosporins - because of pneumonia, continue Ceftin for one more week  Left lower lobe pneumonia - quite likely from aspiration which she is at high risk for having recurrence of. Family aware. Dysphagia - patient requires a dysphagia 1 diet and OK to take thin liquids but only a teaspoon at a time.  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 Family aware. They do not want to pursue any further therapy for for this  Dehydration - resolved - but will need regular encouragement to drink small amounts of fluids frequently throughout the day HTN (hypertension) - controlled - we'll be switching to irbesartan and away from Diovan HCT. Diuretics not necessary at this time Thrombocytopenia - stable  No CODE BLUE  Disposition - We'll plan to transfer by ambulance back to assisted living today.  Recommend that hospice is involved. Life expectancy definitely less than 6 months with probable lung cancer and high likelihood of recurrent pneumonia secondary to aspiration   Discharge Physical Exam:  General Appearance: Alert, cooperative, no distress, appears stated age  Weight change:   Intake/Output Summary (Last 24 hours) at 05/23/13 0731 Last data filed at 05/23/13 0600  Gross per 24 hour  Intake   1265 ml  Output      0 ml  Net   1265 ml   Filed Vitals:   05/22/13 1525 05/22/13 1731 05/22/13 2100 05/23/13 0700  BP: 171/82 102/52 106/63 129/71  Pulse:  66 64 51  Temp:   97.3 F (36.3 C) 98.3 F (36.8 C)  TempSrc:   Oral Oral  Resp:   18 18  Height:      Weight:      SpO2:   98% 98%   General Appearance: Arousable and mildly lethargic but continues to improve.  Oropharynx is moist.  Lungs: to auscultation bilaterally, respirations unlabored. Cough with mild rhonchi  Heart: Regular rate and rhythm, S1 and S2 normal, no murmur, rub or gallop  Abdomen: Soft, non-tender, bowel sounds active all four quadrants, no masses, no organomegaly  Extremities: Extremities normal, atraumatic, no cyanosis or edema  Neuro: Oriented to name, does move all distal extremities  Discharge Condition: Improved but condition guarded for possible worsening with risk for aspiration and probable lung cancer  Hospital Course: Kaylee Braun is a 80 y.o. female with history of hypertension, previous stroke, dementia and the right lung mass was brought to the ER patient was found to be increasingly confused for 24 hours. As per patient's daughter patient has been getting increasingly confused with mild productive cough. Patient did not have any associated nausea vomiting abdominal pain or diarrhea. In the ER patient's labs reveal UTI. Chest x-ray shows known right lung opacity and left lobe infiltrate. Urine grew Escherichia coli resistant to quinolones but sensitive to cephalosporins. Now on Ceftin for 7 more days. Patient seen in consultation by speech therapy and she is at risk for recurrent aspiration and recommendations for thin liquids are just to be fed teaspoons at a time frequently throughout the day. Patient almost certainly has lung cancer with lung masses and I would recommend a hospice referral as soon as possible and for comfort measures to be her primary aim after completion of treatment  for this pneumonia and UTI  Things to follow up in the outpatient setting: Follow suggestions for feeding by speech therapy. Recommend hospice consultation  Consults: Speech therapy  Disposition: 03-Skilled Nursing Facility  Discharge Orders   Future Orders Complete By Expires   Call MD for:  difficulty breathing, headache or visual disturbances  As directed    Call MD for:  severe  uncontrolled pain  As directed    Call MD for:  temperature >100.4  As directed    Diet - low sodium heart healthy  As directed    Increase activity slowly  As directed        Medication List    STOP taking these medications       valsartan-hydrochlorothiazide 160-12.5 MG per tablet  Commonly known as:  DIOVAN-HCT      TAKE these medications       acetaminophen 325 MG tablet  Commonly known as:  TYLENOL  Take 650 mg by mouth every 4 (four) hours as needed. For pain     acetaminophen 325 MG tablet  Commonly known as:  TYLENOL  Take 2 tablets (650 mg total) by mouth every 6 (six) hours as needed for mild pain (or Fever >/= 101).     CALTRATE 600+D 600-400 MG-UNIT per tablet  Generic drug:  Calcium Carbonate-Vitamin D  Take 1 tablet by mouth 2 (two) times daily.     cefUROXime 500 MG tablet  Commonly known as:  CEFTIN  Take 1 tablet (500 mg total) by mouth 2 (two) times daily with a meal.     diltiazem 120 MG 24 hr capsule  Commonly known as:  DILACOR XR  Take 120 mg by mouth daily.     DULoxetine 30 MG capsule  Commonly known as:  CYMBALTA  Take 30 mg by mouth daily.     guaiFENesin-dextromethorphan 100-10 MG/5ML syrup  Commonly known as:  ROBITUSSIN DM  Take 5 mLs by mouth 3 (three) times daily as needed for cough.     hydrocortisone cream 1 %  Apply 1 application topically 2 (two) times daily as needed. For rash     irbesartan 150 MG tablet  Commonly known as:  AVAPRO  Take 1 tablet (150 mg total) by mouth daily.     loratadine 10 MG tablet  Commonly known as:  CLARITIN  Take 10 mg by mouth daily as needed (sneezing).     MINTOX 200-200-20 MG/5ML suspension  Generic drug:  alum & mag hydroxide-simeth  Take 20 mLs by mouth as needed. For heartburn -  not to exceed 3 doses in 24 hours     polyethylene glycol packet  Commonly known as:  MIRALAX / GLYCOLAX  Take 17 g by mouth daily.     saccharomyces boulardii 250 MG capsule  Commonly known as:  FLORASTOR   Take 1 capsule (250 mg total) by mouth 2 (two) times daily.     sodium chloride 0.65 % nasal spray  Commonly known as:  OCEAN  Place 2 sprays into both nostrils 3 (three) times daily as needed. For nasal congestion     triamcinolone cream 0.1 %  Commonly known as:  KENALOG  Apply 1 application topically 2 (two) times daily as needed. For rash on neck         The results of significant diagnostics from this hospitalization (including imaging, microbiology, ancillary and laboratory) are listed below for reference.    Significant Diagnostic Studies: Dg Chest [facility]  05/22/2013  CLINICAL DATA:  Evaluate infiltrate.  Dementia.  EXAM: PORTABLE CHEST - 1 VIEW  COMPARISON:  DG CHEST 1V PORT dated 05/20/2013; CT CHEST W/O CM dated 09/22/2012; DG CHEST 1V PORT dated 05/18/2013  FINDINGS: Midline trachea. Cardiomegaly accentuated by AP portable technique. Probable small left pleural effusion. No pneumothorax. Chronic interstitial thickening. Right perihilar and infrahilar opacity could represent tumor (given the appearance on prior CT) or an area of infection. Patchy left base airspace disease.  IMPRESSION: Similar left base infection or aspiration.  Right perihilar infrahilar opacity. Given appearance on prior CT and 05/18/2013 plain film, favored to represent carcinoma and adenopathy.   Electronically Signed   By: Jeronimo Greaves M.D.   On: 05/22/2013 07:56   Dg Chest Port 1 View  05/20/2013   CLINICAL DATA:  Shortness of breath  EXAM: PORTABLE CHEST - 1 VIEW  COMPARISON:  Portable exam 0752 hr compared to 05/18/2013  FINDINGS: Stable heart size and pulmonary vascularity.  Atherosclerotic calcification aorta.  2 right perihilar masses are again identified, unchanged.  Left basilar atelectasis versus developing consolidation.  Mild bronchitic changes.  Remaining lungs grossly clear.  Bones demineralized.  No gross pleural effusion or pneumothorax.  IMPRESSION: Bronchitic changes with developing  atelectasis versus infiltrate at left base.  Two mass like right perihilar opacities suspicious for tumor; further evaluation by CT chest recommended.   Electronically Signed   By: Ulyses Southward M.D.   On: 05/20/2013 08:35   Dg Chest Portable 1 View  05/18/2013   CLINICAL DATA:  Cough for 1 week, bruising to the left shoulder, altered mental status  EXAM: PORTABLE CHEST - 1 VIEW  COMPARISON:  Portable exam 1941 hr compared to 09/20/2012  FINDINGS: Normal heart size and pulmonary vascularity.  Atherosclerotic calcification aorta.  Right infrahilar focal opacity question mass versus infiltrate, poorly defined, question 5 x 4 cm.  Additional nodular density however in the lower right chest is also seen, 2.2 x 2.2 cm, also suspicious for mass.  Bronchitic changes and scattered initial prominence in the mid to lower lungs noted.  Mild left basilar atelectasis.  Remaining lungs clear.  No gross pleural effusion or pneumothorax.  Bones demineralized.  IMPRESSION: Chronic bronchitic interstitial changes with right infrahilar opacity and additional right lung base nodule, worrisome for tumor.  Area of focal opacity more peripherally in the right lung on the prior CT is no longer identified.  If clinically indicated these could be better evaluated by CT chest.   Electronically Signed   By: Ulyses Southward M.D.   On: 05/18/2013 20:03    Microbiology: Recent Results (from the past 240 hour(s))  CULTURE, BLOOD (ROUTINE X 2)     Status: None   Collection Time    05/18/13  7:25 PM      Result Value Ref Range Status   Specimen Description BLOOD LEFT FOREARM   Final   Special Requests BOTTLES DRAWN AEROBIC AND ANAEROBIC 3CC   Final   Culture  Setup Time     Final   Value: 05/19/2013 00:54     Performed at [facility]   Culture     Final   Value: STAPHYLOCOCCUS SPECIES (COAGULASE NEGATIVE)     Note: THE SIGNIFICANCE OF ISOLATING THIS ORGANISM FROM A SINGLE SET OF BLOOD CULTURES WHEN MULTIPLE SETS ARE DRAWN IS  UNCERTAIN. PLEASE NOTIFY THE MICROBIOLOGY DEPARTMENT WITHIN ONE WEEK IF SPECIATION AND SENSITIVITIES ARE REQUIRED.     Note: Gram Stain Report Called to,Read Back By and  Verified With: SHANNON YOUNG ON 05/19/2013 AT 11:31P BY Serafina Mitchell     Performed at [facility]   Report Status 05/22/2013 FINAL   Final  CULTURE, BLOOD (ROUTINE X 2)     Status: None   Collection Time    05/18/13  7:34 PM      Result Value Ref Range Status   Specimen Description BLOOD RIGHT ANTECUBITAL   Final   Special Requests BOTTLES DRAWN AEROBIC AND ANAEROBIC 5CC   Final   Culture  Setup Time     Final   Value: 05/19/2013 00:54     Performed at [facility]   Culture     Final   Value:        BLOOD CULTURE RECEIVED NO GROWTH TO DATE CULTURE WILL BE HELD FOR 5 DAYS BEFORE ISSUING A FINAL NEGATIVE REPORT     Performed at [facility]   Report Status PENDING   Incomplete  URINE CULTURE     Status: None   Collection Time    05/18/13  8:49 PM      Result Value Ref Range Status   Specimen Description URINE, CATHETERIZED   Final   Special Requests NONE   Final   Culture  Setup Time     Final   Value: 05/19/2013 02:36     Performed at [facility] Count     Final   Value: >=100,000 COLONIES/ML     Performed at [facility]   Culture     Final   Value: ESCHERICHIA COLI     Performed at [facility]   Report Status 05/20/2013 FINAL   Final   Organism ID, Bacteria ESCHERICHIA COLI   Final     Labs: Results for orders placed during the hospital encounter of 05/18/13  CULTURE, BLOOD (ROUTINE X 2)      Result Value Ref Range   Specimen Description BLOOD RIGHT ANTECUBITAL     Special Requests BOTTLES DRAWN AEROBIC AND ANAEROBIC 5CC     Culture  Setup Time       Value: 05/19/2013 00:54     Performed at [facility]   Culture       Value:        BLOOD CULTURE RECEIVED NO GROWTH TO DATE CULTURE WILL BE HELD FOR 5 DAYS BEFORE ISSUING A FINAL NEGATIVE REPORT      Performed at [facility]   Report Status PENDING    CULTURE, BLOOD (ROUTINE X 2)      Result Value Ref Range   Specimen Description BLOOD LEFT FOREARM     Special Requests BOTTLES DRAWN AEROBIC AND ANAEROBIC 3CC     Culture  Setup Time       Value: 05/19/2013 00:54     Performed at [facility]   Culture       Value: STAPHYLOCOCCUS SPECIES (COAGULASE NEGATIVE)     Note: THE SIGNIFICANCE OF ISOLATING THIS ORGANISM FROM A SINGLE SET OF BLOOD CULTURES WHEN MULTIPLE SETS ARE DRAWN IS UNCERTAIN. PLEASE NOTIFY THE MICROBIOLOGY DEPARTMENT WITHIN ONE WEEK IF SPECIATION AND SENSITIVITIES ARE REQUIRED.     Note: Gram Stain Report Called to,Read Back By and Verified With: SHANNON YOUNG ON 05/19/2013 AT 11:31P BY Serafina Mitchell     Performed at [facility]   Report Status 05/22/2013 FINAL    URINE CULTURE      Result Value Ref Range   Specimen Description URINE, CATHETERIZED  Special Requests NONE     Culture  Setup Time       Value: 05/19/2013 02:36     [facility]   Colony Count       Value: >=100,000 COLONIES/ML     [facility]   Culture       Value: ESCHERICHIA COLI     [facility]   Report Status 05/20/2013 FINAL     Organism ID, Bacteria ESCHERICHIA COLI    CBC WITH DIFFERENTIAL      Result Value Ref Range   WBC 4.6  4.0 - 10.5 K/uL   RBC 4.15  3.87 - 5.11 MIL/uL   Hemoglobin 12.0  12.0 - 15.0 g/dL   HCT 87.537.1  64.336.0 - 32.946.0 %   MCV 89.4  78.0 - 100.0 fL   MCH 28.9  26.0 - 34.0 pg   MCHC 32.3  30.0 - 36.0 g/dL   RDW 51.814.4  84.111.5 - 66.015.5 %   Platelets 128 (*) 150 - 400 K/uL   Neutrophils Relative % 54  43 - 77 %   Neutro Abs 2.5  1.7 - 7.7 K/uL   Lymphocytes Relative 13  12 - 46 %   Lymphs Abs 0.6 (*) 0.7 - 4.0 K/uL   Monocytes Relative 32 (*) 3 - 12 %   Monocytes Absolute 1.5 (*) 0.1 - 1.0 K/uL   Eosinophils Relative 0  0 - 5 %   Eosinophils Absolute 0.0  0.0 - 0.7 K/uL   Basophils Relative  0  0 - 1 %   Basophils Absolute 0.0  0.0 - 0.1 K/uL  COMPREHENSIVE METABOLIC PANEL      Result Value Ref Range   Sodium 137  137 - 147 mEq/L   Potassium 4.1  3.7 - 5.3 mEq/L   Chloride 100  96 - 112 mEq/L   CO2 23  19 - 32 mEq/L   Glucose, Bld 129 (*) 70 - 99 mg/dL   BUN 31 (*) 6 - 23 mg/dL   Creatinine, Ser 6.301.33 (*) 0.50 - 1.10 mg/dL   Calcium 16.010.0  8.4 - 10.910.5 mg/dL   Total Protein 6.8  6.0 - 8.3 g/dL   Albumin 3.0 (*) 3.5 - 5.2 g/dL   AST 26  0 - 37 U/L   ALT 13  0 - 35 U/L   Alkaline Phosphatase 73  39 - 117 U/L   Total Bilirubin 0.2 (*) 0.3 - 1.2 mg/dL   GFR calc non Af Amer 34 (*) >90 mL/min   GFR calc Af Amer 40 (*) >90 mL/min  URINALYSIS, ROUTINE W REFLEX MICROSCOPIC      Result Value Ref Range   Color, Urine YELLOW  YELLOW   APPearance CLOUDY (*) CLEAR   Specific Gravity, Urine 1.016  1.005 - 1.030   pH 6.0  5.0 - 8.0   Glucose, UA NEGATIVE  NEGATIVE mg/dL   Hgb urine dipstick TRACE (*) NEGATIVE   Bilirubin Urine NEGATIVE  NEGATIVE   Ketones, ur NEGATIVE  NEGATIVE mg/dL   Protein, ur NEGATIVE  NEGATIVE mg/dL   Urobilinogen, UA 0.2  0.0 - 1.0 mg/dL   Nitrite NEGATIVE  NEGATIVE   Leukocytes, UA SMALL (*) NEGATIVE  URINE MICROSCOPIC-ADD ON      Result Value Ref Range   Squamous Epithelial / LPF RARE  RARE   WBC, UA 11-20  <3 WBC/hpf   RBC / HPF 0-2  <3 RBC/hpf   Bacteria,  UA MANY (*) RARE  BASIC METABOLIC PANEL      Result Value Ref Range   Sodium 139  137 - 147 mEq/L   Potassium 4.1  3.7 - 5.3 mEq/L   Chloride 104  96 - 112 mEq/L   CO2 25  19 - 32 mEq/L   Glucose, Bld 91  70 - 99 mg/dL   BUN 23  6 - 23 mg/dL   Creatinine, Ser 9.60 (*) 0.50 - 1.10 mg/dL   Calcium 8.9  8.4 - 45.4 mg/dL   GFR calc non Af Amer 42 (*) >90 mL/min   GFR calc Af Amer 49 (*) >90 mL/min  CBC      Result Value Ref Range   WBC 4.4  4.0 - 10.5 K/uL   RBC 3.94  3.87 - 5.11 MIL/uL   Hemoglobin 11.5 (*) 12.0 - 15.0 g/dL   HCT 09.8 (*) 11.9 - 14.7 %   MCV 90.4  78.0 - 100.0 fL   MCH  29.2  26.0 - 34.0 pg   MCHC 32.3  30.0 - 36.0 g/dL   RDW 82.9  56.2 - 13.0 %   Platelets 111 (*) 150 - 400 K/uL  BASIC METABOLIC PANEL      Result Value Ref Range   Sodium 133 (*) 137 - 147 mEq/L   Potassium 3.6 (*) 3.7 - 5.3 mEq/L   Chloride 99  96 - 112 mEq/L   CO2 19  19 - 32 mEq/L   Glucose, Bld 86  70 - 99 mg/dL   BUN 21  6 - 23 mg/dL   Creatinine, Ser 8.65  0.50 - 1.10 mg/dL   Calcium 9.0  8.4 - 78.4 mg/dL   GFR calc non Af Amer 56 (*) >90 mL/min   GFR calc Af Amer 65 (*) >90 mL/min  CBC WITH DIFFERENTIAL      Result Value Ref Range   WBC 6.4  4.0 - 10.5 K/uL   RBC 4.11  3.87 - 5.11 MIL/uL   Hemoglobin 12.1  12.0 - 15.0 g/dL   HCT 69.6  29.5 - 28.4 %   MCV 88.3  78.0 - 100.0 fL   MCH 29.4  26.0 - 34.0 pg   MCHC 33.3  30.0 - 36.0 g/dL   RDW 13.2  44.0 - 10.2 %   Platelets 119 (*) 150 - 400 K/uL   Neutrophils Relative % 67  43 - 77 %   Neutro Abs 4.2  1.7 - 7.7 K/uL   Lymphocytes Relative 15  12 - 46 %   Lymphs Abs 0.9  0.7 - 4.0 K/uL   Monocytes Relative 19 (*) 3 - 12 %   Monocytes Absolute 1.2 (*) 0.1 - 1.0 K/uL   Eosinophils Relative 0  0 - 5 %   Eosinophils Absolute 0.0  0.0 - 0.7 K/uL   Basophils Relative 0  0 - 1 %   Basophils Absolute 0.0  0.0 - 0.1 K/uL  BASIC METABOLIC PANEL      Result Value Ref Range   Sodium 137  137 - 147 mEq/L   Potassium 3.9  3.7 - 5.3 mEq/L   Chloride 102  96 - 112 mEq/L   CO2 23  19 - 32 mEq/L   Glucose, Bld 103 (*) 70 - 99 mg/dL   BUN 19  6 - 23 mg/dL   Creatinine, Ser 7.25  0.50 - 1.10 mg/dL   Calcium 9.3  8.4 - 36.6 mg/dL   GFR calc non  Af Amer 54 (*) >90 mL/min   GFR calc Af Amer 63 (*) >90 mL/min  CBC WITH DIFFERENTIAL      Result Value Ref Range   WBC 4.8  4.0 - 10.5 K/uL   RBC 3.74 (*) 3.87 - 5.11 MIL/uL   Hemoglobin 11.0 (*) 12.0 - 15.0 g/dL   HCT 16.1 (*) 09.6 - 04.5 %   MCV 87.7  78.0 - 100.0 fL   MCH 29.4  26.0 - 34.0 pg   MCHC 33.5  30.0 - 36.0 g/dL   RDW 40.9  81.1 - 91.4 %   Platelets 114 (*) 150 - 400 K/uL    Neutrophils Relative % 61  43 - 77 %   Neutro Abs 2.9  1.7 - 7.7 K/uL   Lymphocytes Relative 20  12 - 46 %   Lymphs Abs 1.0  0.7 - 4.0 K/uL   Monocytes Relative 19 (*) 3 - 12 %   Monocytes Absolute 0.9  0.1 - 1.0 K/uL   Eosinophils Relative 0  0 - 5 %   Eosinophils Absolute 0.0  0.0 - 0.7 K/uL   Basophils Relative 0  0 - 1 %   Basophils Absolute 0.0  0.0 - 0.1 K/uL  CBC WITH DIFFERENTIAL      Result Value Ref Range   WBC 4.1  4.0 - 10.5 K/uL   RBC 3.78 (*) 3.87 - 5.11 MIL/uL   Hemoglobin 11.2 (*) 12.0 - 15.0 g/dL   HCT 78.2 (*) 95.6 - 21.3 %   MCV 88.1  78.0 - 100.0 fL   MCH 29.6  26.0 - 34.0 pg   MCHC 33.6  30.0 - 36.0 g/dL   RDW 08.6  57.8 - 46.9 %   Platelets 109 (*) 150 - 400 K/uL   Neutrophils Relative % 59  43 - 77 %   Neutro Abs 2.4  1.7 - 7.7 K/uL   Lymphocytes Relative 25  12 - 46 %   Lymphs Abs 1.0  0.7 - 4.0 K/uL   Monocytes Relative 16 (*) 3 - 12 %   Monocytes Absolute 0.6  0.1 - 1.0 K/uL   Eosinophils Relative 0  0 - 5 %   Eosinophils Absolute 0.0  0.0 - 0.7 K/uL   Basophils Relative 1  0 - 1 %   Basophils Absolute 0.0  0.0 - 0.1 K/uL  BASIC METABOLIC PANEL      Result Value Ref Range   Sodium 137  137 - 147 mEq/L   Potassium 3.9  3.7 - 5.3 mEq/L   Chloride 102  96 - 112 mEq/L   CO2 26  19 - 32 mEq/L   Glucose, Bld 101 (*) 70 - 99 mg/dL   BUN 14  6 - 23 mg/dL   Creatinine, Ser 6.29  0.50 - 1.10 mg/dL   Calcium 9.4  8.4 - 52.8 mg/dL   GFR calc non Af Amer 60 (*) >90 mL/min   GFR calc Af Amer 69 (*) >90 mL/min  I-STAT CG4 LACTIC ACID, ED      Result Value Ref Range   Lactic Acid, Venous 1.93  0.5 - 2.2 mmol/L  CBG MONITORING, ED      Result Value Ref Range   Glucose-Capillary 96  70 - 99 mg/dL   Comment 1 Notify RN      Time coordinating discharge: 40 minutes  Signed: Pearla Dubonnet, MD 05/23/2013, 7:31 AM

## 2013-05-23 NOTE — Progress Notes (Addendum)
CSW called [facility] and [employer] for [facility].  Adrianna Dudas C. [place] MSW, [place] Spoke with [facility]. They are prepared to take patient back today. All information was faxed. Packet copied and placed in [place]. Met with patient and patient's son at bedside. Patients son is agreeable to return to [facility] and is requesting ptar as he says that is what MD recommended to him. He understands that there is no guarantee of payment. ptar called for transportation. Falesha Schommer C. [place] MSW, [place]

## 2013-05-25 LAB — CULTURE, BLOOD (ROUTINE X 2): CULTURE: NO GROWTH

## 2013-07-25 DEATH — deceased

## 2015-01-25 IMAGING — CT CT CHEST W/O CM
2 of 3 series · 15 of 36 positions shown, 18 images · non-contrast
Comparison: Radiographs 11/20/2005 and 09/20/2012.  CT 05/29/2005.

CLINICAL DATA: Lung mass on radiographs.  Cough.

CT CHEST WITHOUT CONTRAST
TECHNIQUE: Multidetector CT imaging of the chest was performed
following the standard protocol without IV contrast.

[Series 2: routine chest · axial · 0.64mm/px · z∈[-277,-47]mm · 12 of 55 slices shown, 15 images]
[im 5/55  mediastinal]
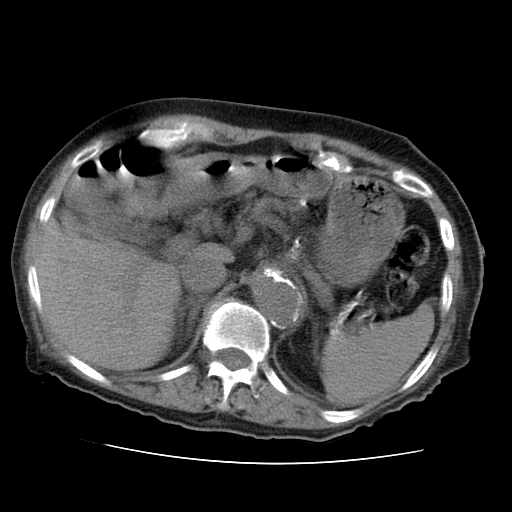
[im 5/55  lung]
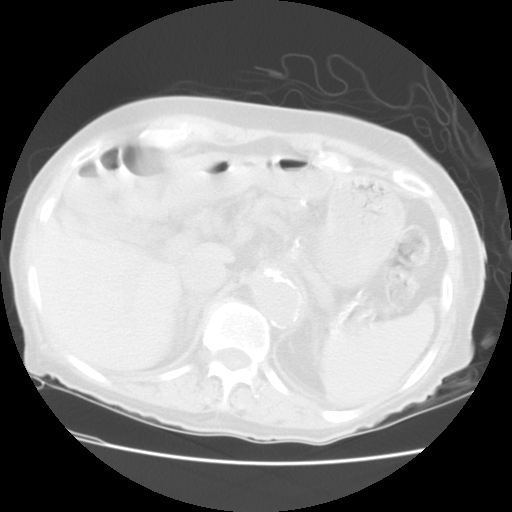
[im 9/55  lung]
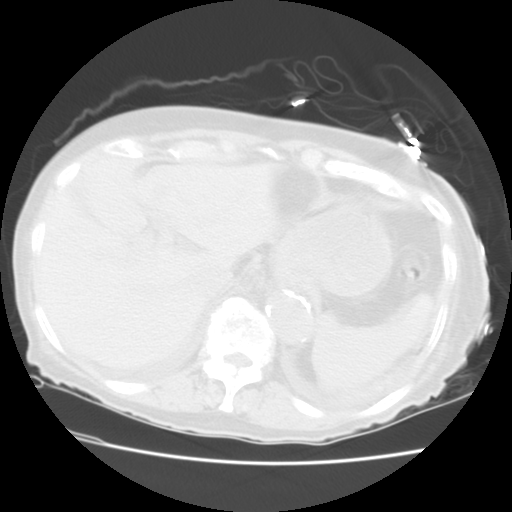
[im 13/55  lung]
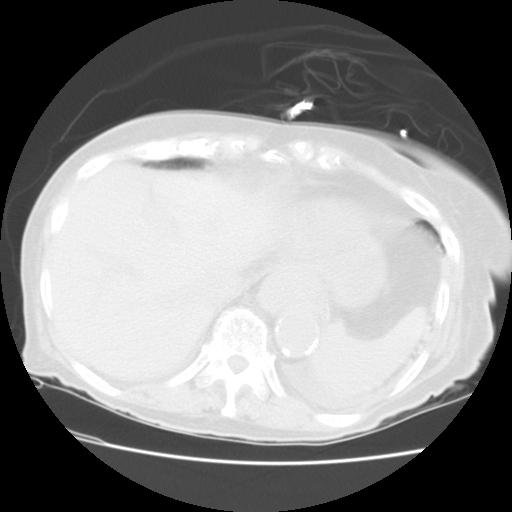
[im 17/55  lung]
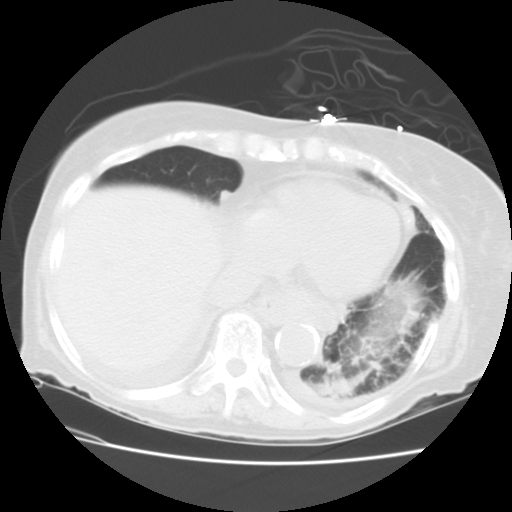
[im 21/55  mediastinal]
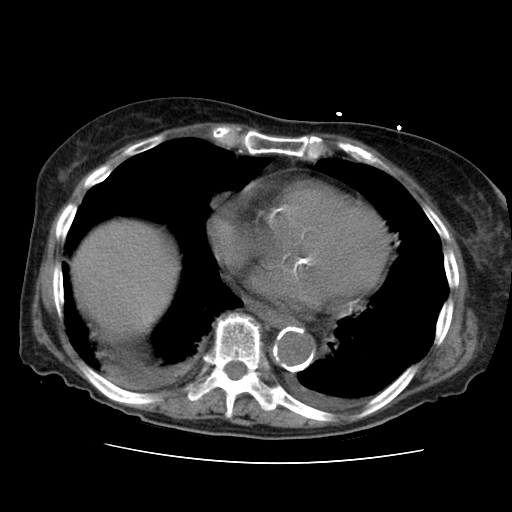
[im 21/55  lung]
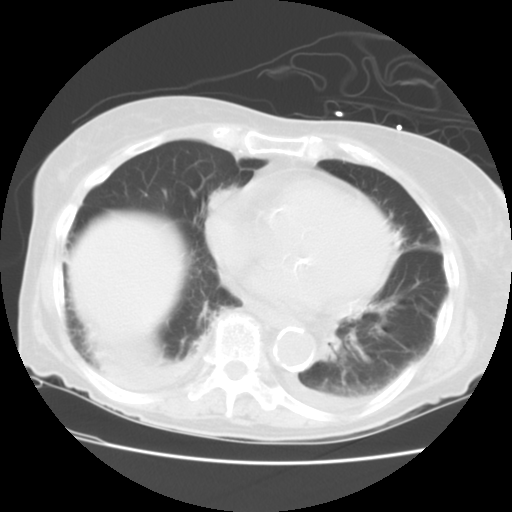
[im 25/55  lung]
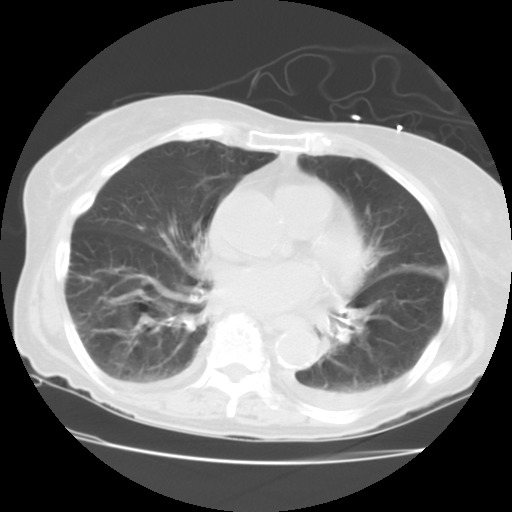
[im 31/55  lung]
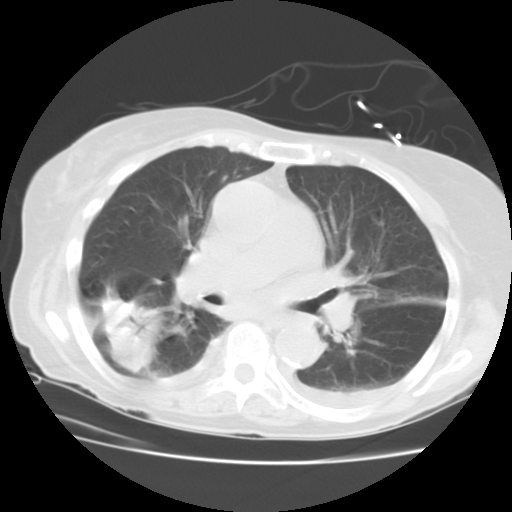
[im 35/55  lung]
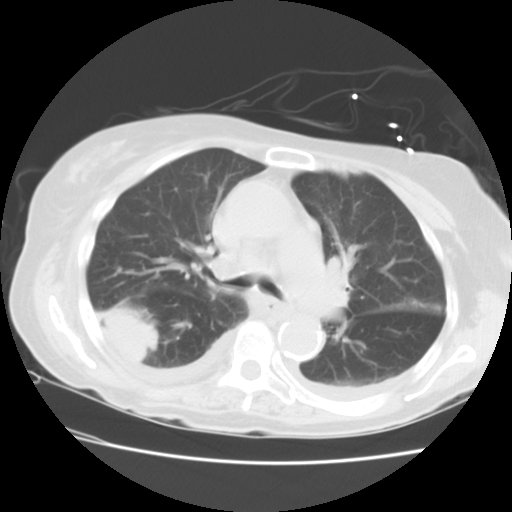
[im 39/55  mediastinal]
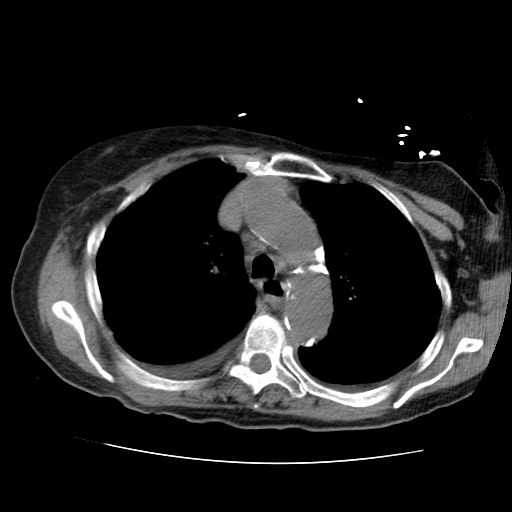
[im 39/55  lung]
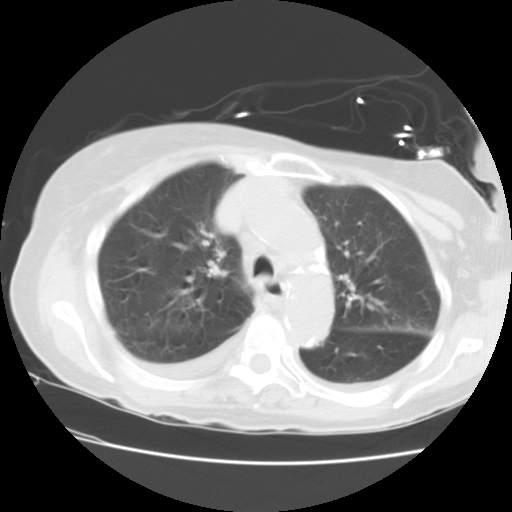
[im 43/55  lung]
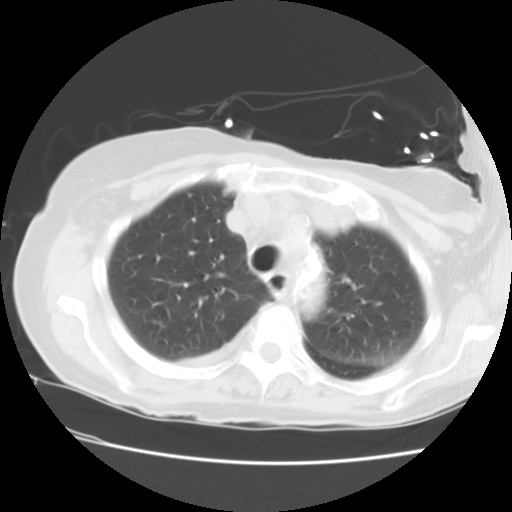
[im 47/55  lung]
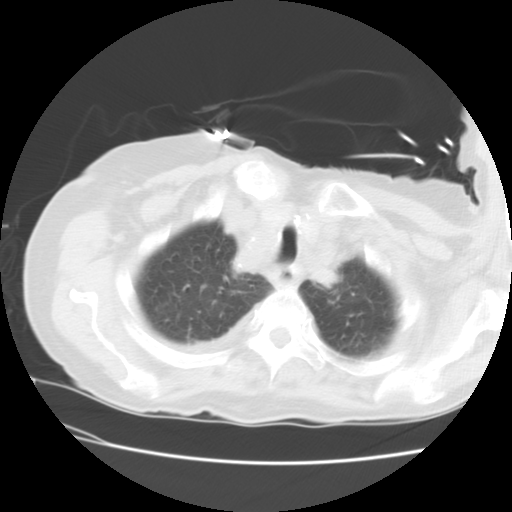
[im 51/55  lung]
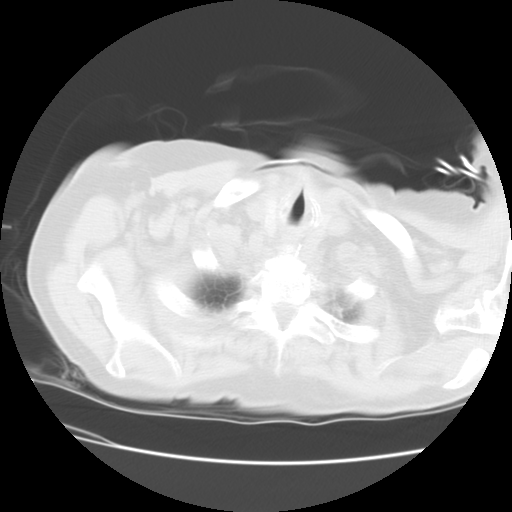

[Series 401: coronal chest · coronal · 0.64mm/px · 3 of 69 slices shown]
[im 14/69  lung]
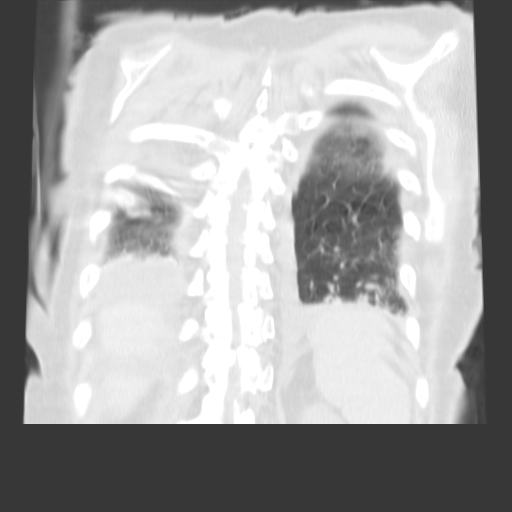
[im 28/69  lung]
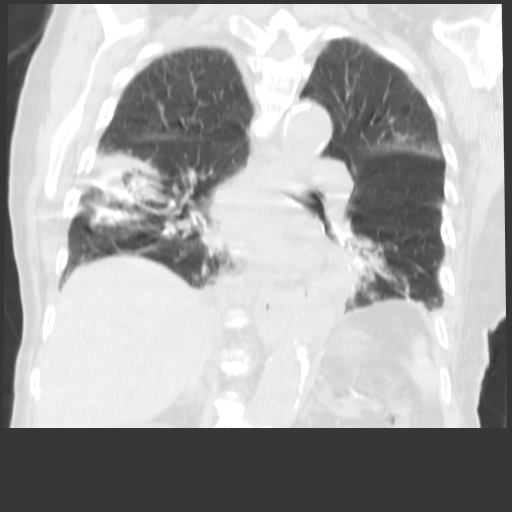
[im 41/69  lung]
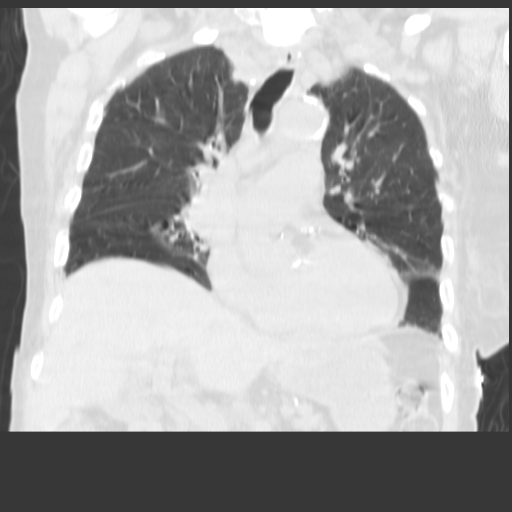

[15 of 36 positions shown; findings below may reference images not displayed]

FINDINGS: Study is moderately motion degraded.  As demonstrated
radiographically, there is a dominant lobulated right lower lobe
mass which measures approximately 4.5 x 4.1 cm.  This demonstrates
central necrosis.  There are no other highly suspicious nodules.
There is a 4 mm right upper lobe nodule on image 21 which appears
stable.  There is atelectasis at both lung bases.

There are small bilateral pleural effusions.  There is no
pericardial effusion.  Diffuse atherosclerosis of the aorta, great
vessels and coronary arteries is noted.

Compared with the prior study, mediastinal lymph nodes have mildly
enlarged, including a precarinal node measuring 1.4 cm on image 19
and an approximately 1.5 cm subcarinal node on image 25.  Hilar
assessment is limited without contrast.

The visualized upper abdomen has a stable appearance.  There is a
small hiatal hernia.  No adrenal mass is demonstrated.  There are
no worrisome osseous findings.
IMPRESSION: 1.  CT confirms the presence of a lobulated right lower lobe mass
morphologically concerning for lung cancer. Tissue sampling
recommended.  PET CT may be helpful for further staging.
2.  Possible slight enlargement of precarinal and subcarinal lymph
nodes.  Nodal metastases cannot be excluded.
3.  Small bilateral pleural effusions with bibasilar atelectasis.

## 2015-09-20 IMAGING — CR DG CHEST 1V PORT
1 series · 1 of 1 positions shown · non-contrast
Comparison: Portable exam 9129 hr compared to 09/20/2012

CLINICAL DATA: Cough for 1 week, bruising to the left shoulder,
altered mental status

EXAM:
PORTABLE CHEST - 1 VIEW

[AP]
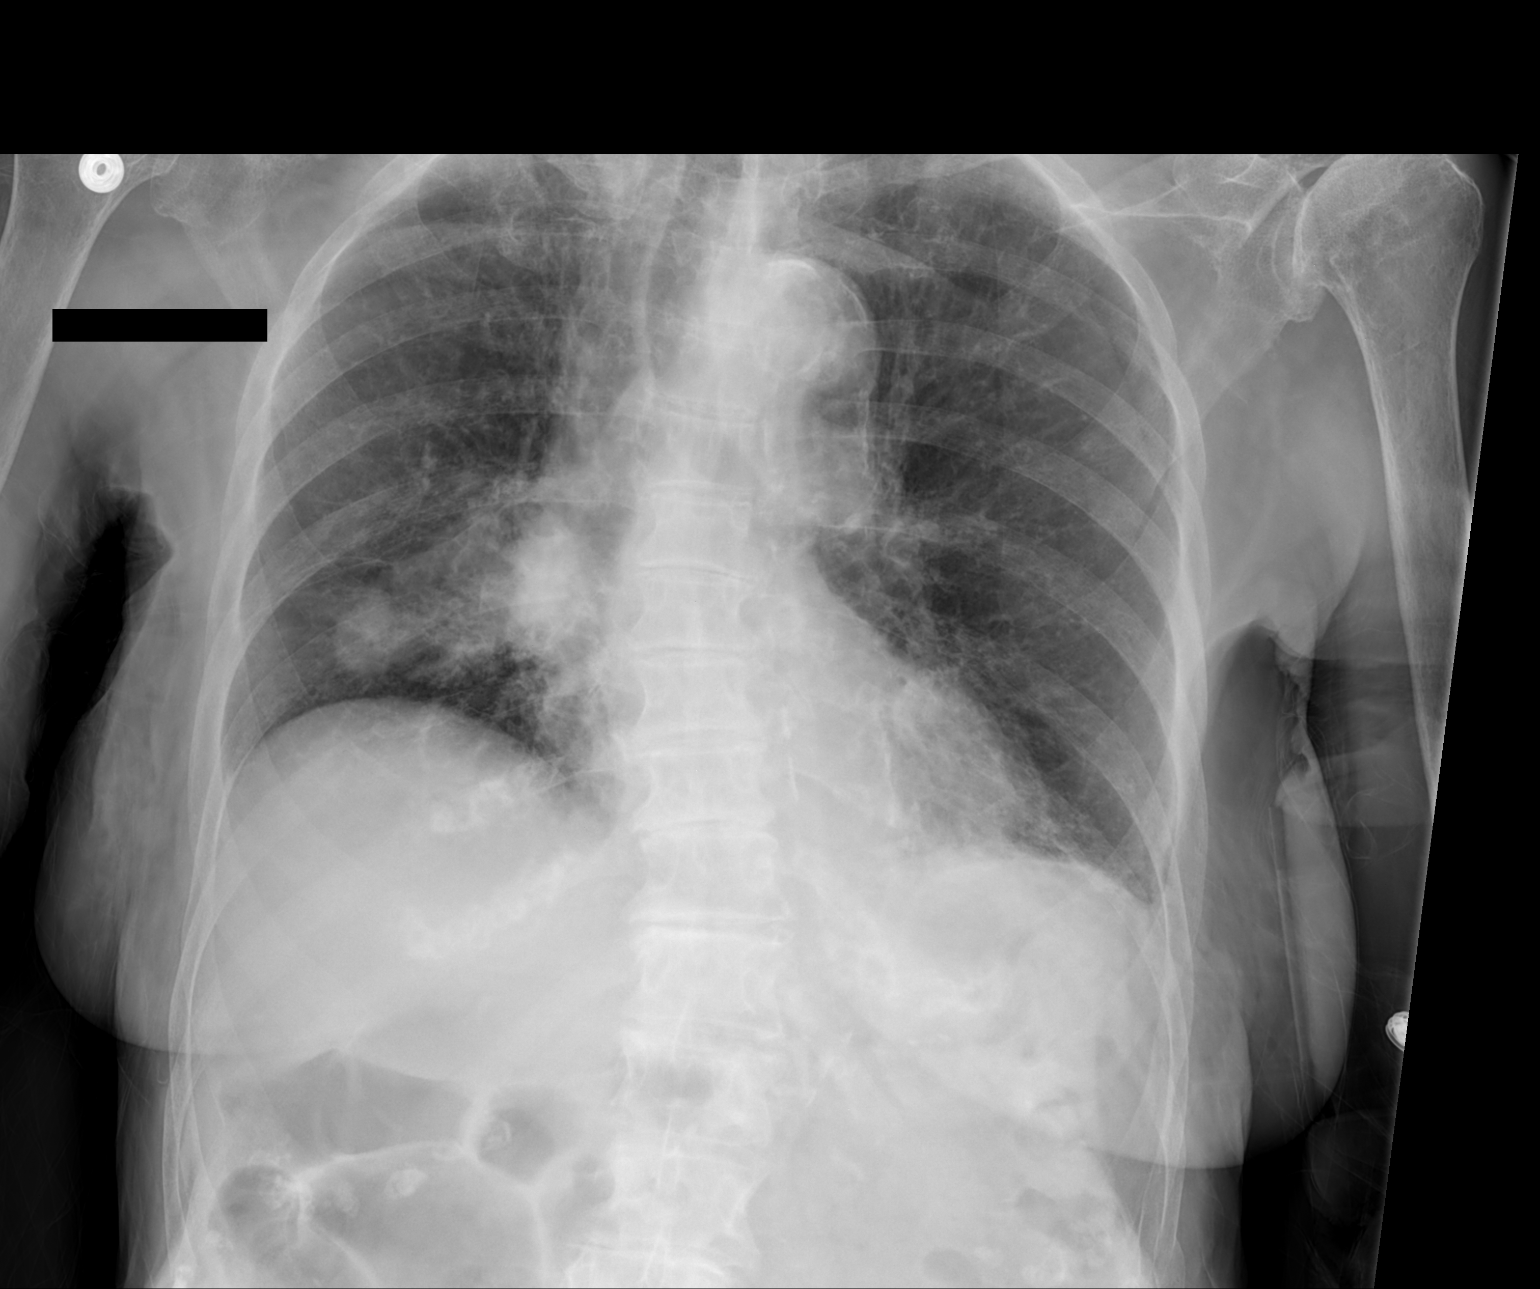

[1 of 1 positions shown; findings below may reference images not displayed]

FINDINGS: Normal heart size and pulmonary vascularity.

Atherosclerotic calcification aorta.

Right infrahilar focal opacity question mass versus infiltrate,
poorly defined, question 5 x 4 cm.

Additional nodular density however in the lower right chest is also
seen, 2.2 x 2.2 cm, also suspicious for mass.

Bronchitic changes and scattered initial prominence in the mid to
lower lungs noted.

Mild left basilar atelectasis.

Remaining lungs clear.

No gross pleural effusion or pneumothorax.

Bones demineralized.
IMPRESSION: Chronic bronchitic interstitial changes with right infrahilar
opacity and additional right lung base nodule, worrisome for tumor.

Area of focal opacity more peripherally in the right lung on the
prior CT is no longer identified.

If clinically indicated these could be better evaluated by CT chest.

## 2015-09-24 IMAGING — CR DG CHEST 1V PORT
1 series · 1 of 1 positions shown · non-contrast
Comparison: DG CHEST 1V PORT dated 05/20/2013; CT CHEST W/O CM dated
09/22/2012; DG CHEST 1V PORT dated 05/18/2013

CLINICAL DATA: Evaluate infiltrate.  Dementia.

EXAM:
PORTABLE CHEST - 1 VIEW

[AP]
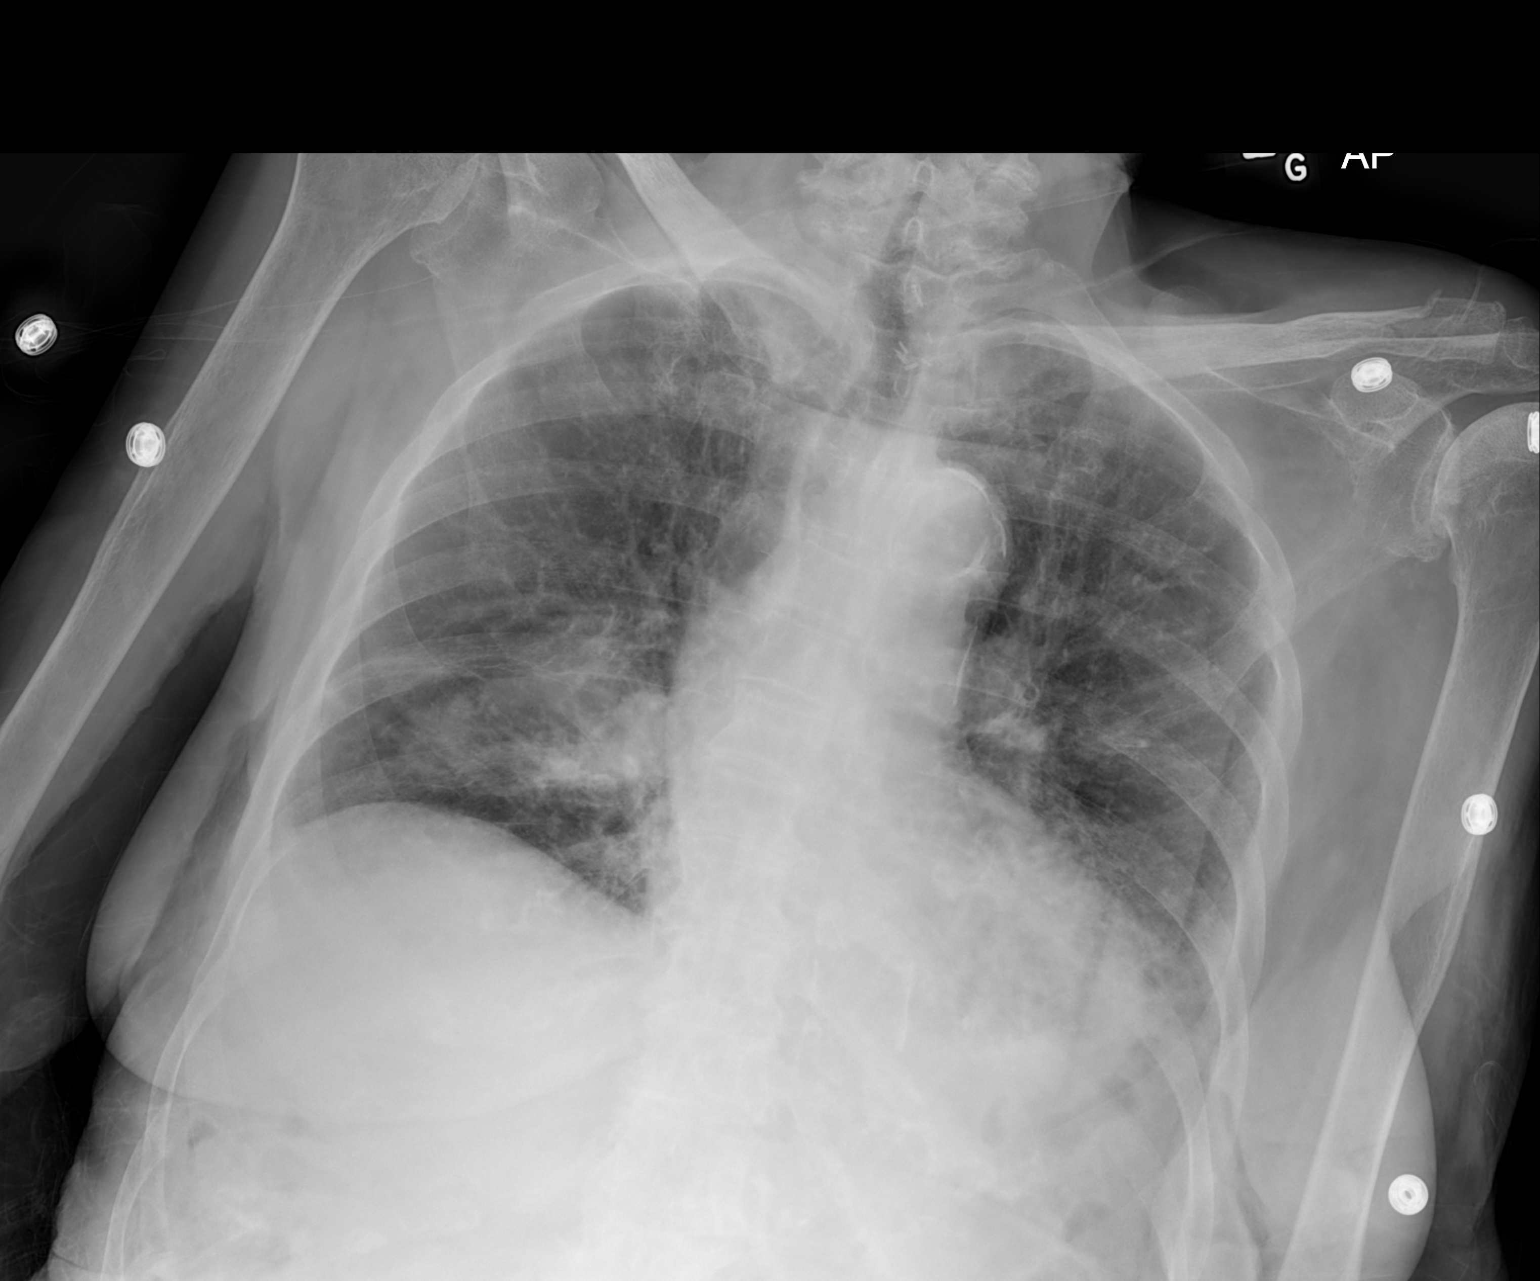

[1 of 1 positions shown; findings below may reference images not displayed]

FINDINGS: Midline trachea. Cardiomegaly accentuated by AP portable technique.
Probable small left pleural effusion. No pneumothorax. Chronic
interstitial thickening. Right perihilar and infrahilar opacity
could represent tumor (given the appearance on prior CT) or an area
of infection. Patchy left base airspace disease.
IMPRESSION: Similar left base infection or aspiration.

Right perihilar infrahilar opacity. Given appearance on prior CT and
05/18/2013 plain film, favored to represent carcinoma and
adenopathy.
